# Patient Record
Sex: Male | Born: 1970 | Race: White | Hispanic: No | Marital: Married | Smoking: Never smoker
Health system: Southern US, Community
[De-identification: ages and names within clinical notes are randomized; demographics above are authoritative.]

## PROBLEM LIST (undated history)

## (undated) DIAGNOSIS — T7840XA Allergy, unspecified, initial encounter: Secondary | ICD-10-CM

## (undated) DIAGNOSIS — N200 Calculus of kidney: Secondary | ICD-10-CM

## (undated) DIAGNOSIS — Z789 Other specified health status: Secondary | ICD-10-CM

## (undated) DIAGNOSIS — I10 Essential (primary) hypertension: Secondary | ICD-10-CM

## (undated) HISTORY — PX: EXTRACORPOREAL SHOCK WAVE LITHOTRIPSY: SHX1557

## (undated) HISTORY — DX: Allergy, unspecified, initial encounter: T78.40XA

## (undated) HISTORY — DX: Essential (primary) hypertension: I10

## (undated) HISTORY — DX: Calculus of kidney: N20.0

---

## 1998-11-30 ENCOUNTER — Emergency Department (HOSPITAL_COMMUNITY): Admission: EM | Admit: 1998-11-30 | Discharge: 1998-11-30 | Payer: Self-pay | Admitting: Emergency Medicine

## 1998-11-30 ENCOUNTER — Encounter: Payer: Self-pay | Admitting: Emergency Medicine

## 2007-01-13 ENCOUNTER — Emergency Department: Payer: Self-pay | Admitting: Emergency Medicine

## 2007-02-04 ENCOUNTER — Emergency Department: Payer: Self-pay | Admitting: Emergency Medicine

## 2007-02-05 ENCOUNTER — Ambulatory Visit: Payer: Self-pay | Admitting: Specialist

## 2008-06-16 ENCOUNTER — Emergency Department: Payer: Self-pay | Admitting: Emergency Medicine

## 2008-06-22 ENCOUNTER — Ambulatory Visit: Payer: Self-pay | Admitting: Urology

## 2008-06-23 ENCOUNTER — Ambulatory Visit: Payer: Self-pay | Admitting: Urology

## 2008-07-25 ENCOUNTER — Ambulatory Visit: Payer: Self-pay | Admitting: Urology

## 2008-07-28 ENCOUNTER — Ambulatory Visit: Payer: Self-pay | Admitting: Urology

## 2009-06-29 IMAGING — CT CT ABD-PELV W/O CM
1 of 2 series · 15 of 32 positions shown, 19 images · non-contrast
Comparison: none

REASON FOR EXAM: (1) left flank p; (2) left flank pain
COMMENTS:

[Series 2: soft tissue · axial · 0.91mm/px · z∈[-1036,-523]mm · 15 of 189 slices shown, 19 images]
[im 9/189  soft-tissue]
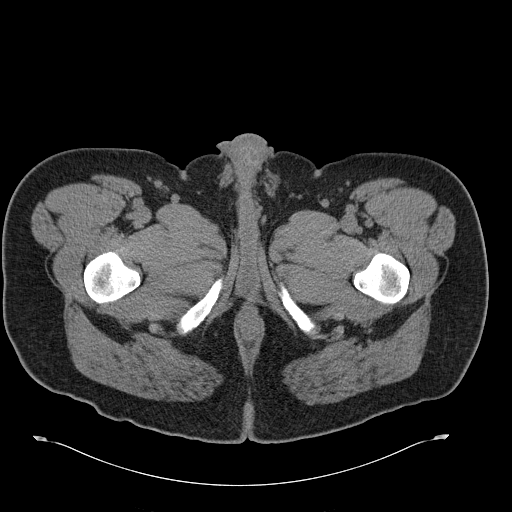
[im 9/189  bone]
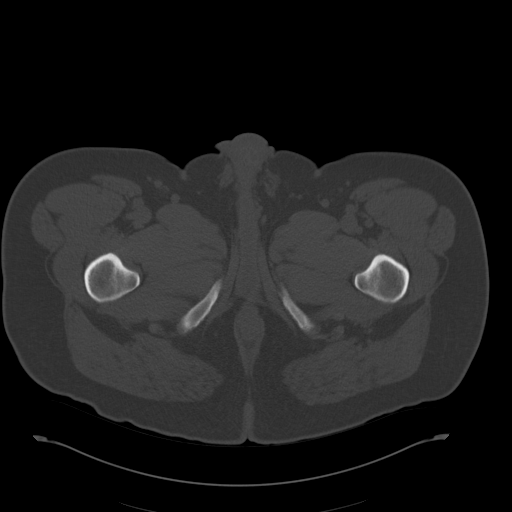
[im 25/189  soft-tissue]
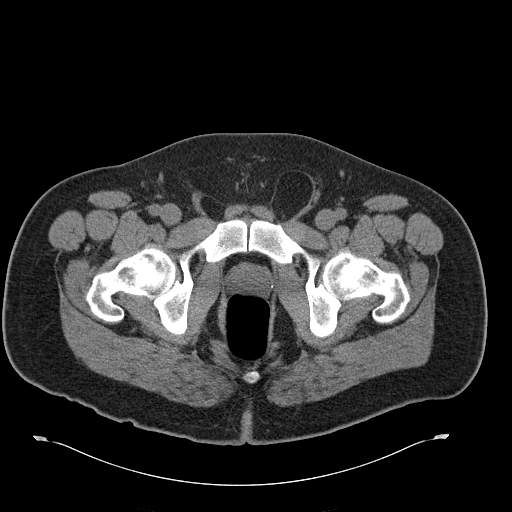
[im 41/189  soft-tissue]
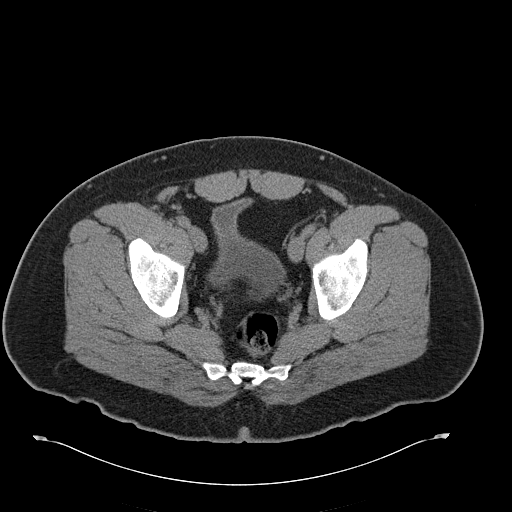
[im 50/189  soft-tissue]
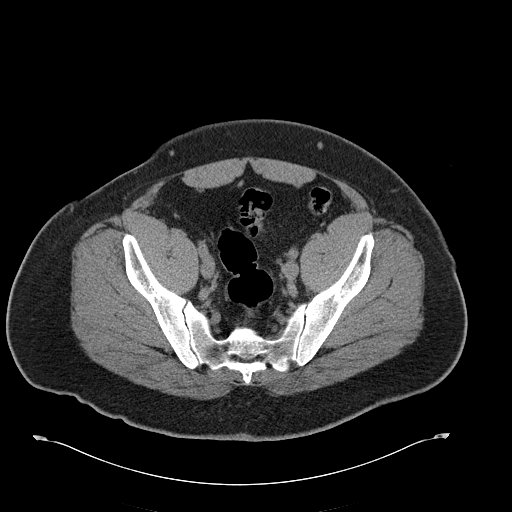
[im 66/189  soft-tissue]
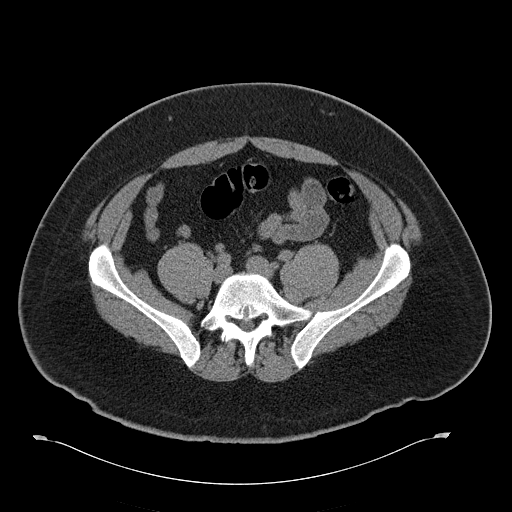
[im 82/189  soft-tissue]
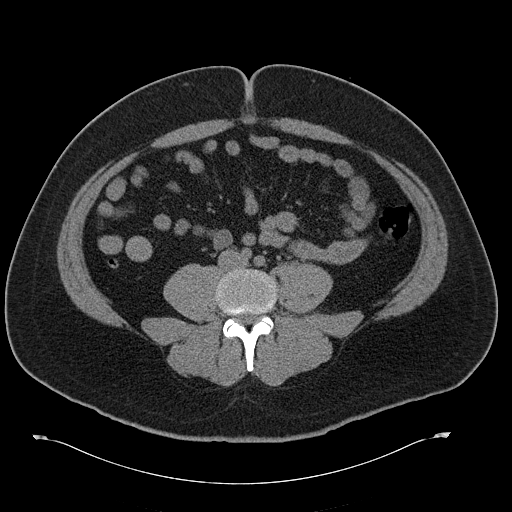
[im 99/189  soft-tissue]
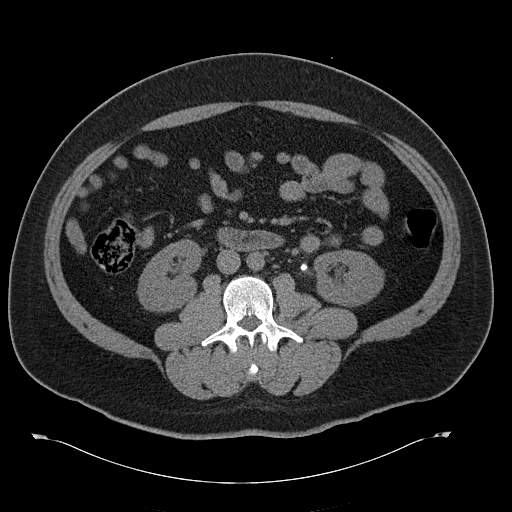
[im 107/189  soft-tissue]
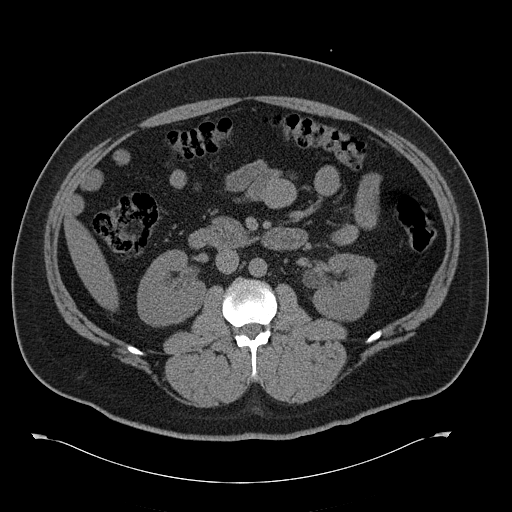
[im 123/189  soft-tissue]
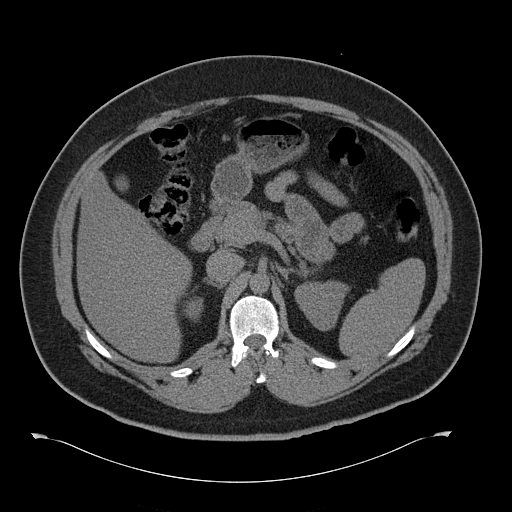
[im 123/189  bone]
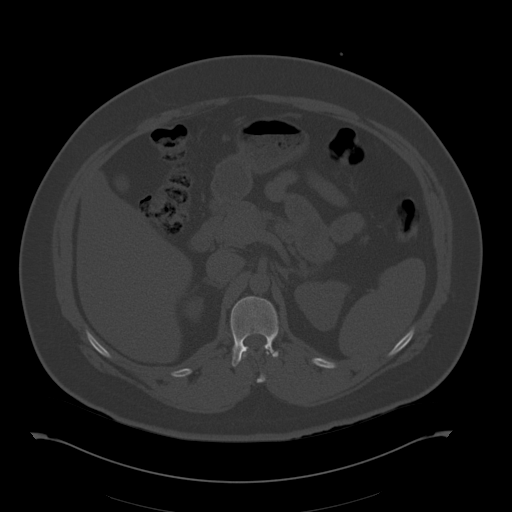
[im 139/189  soft-tissue]
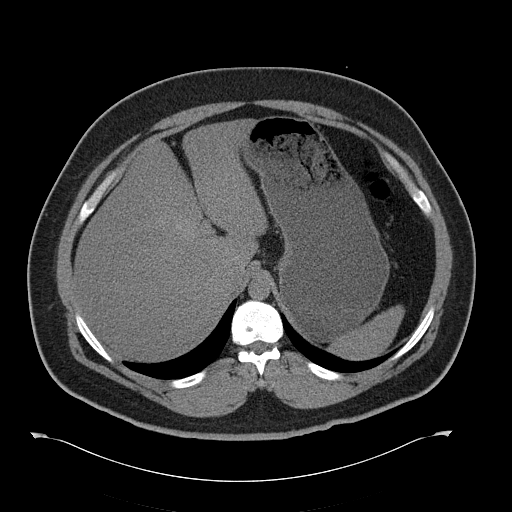
[im 148/189  soft-tissue]
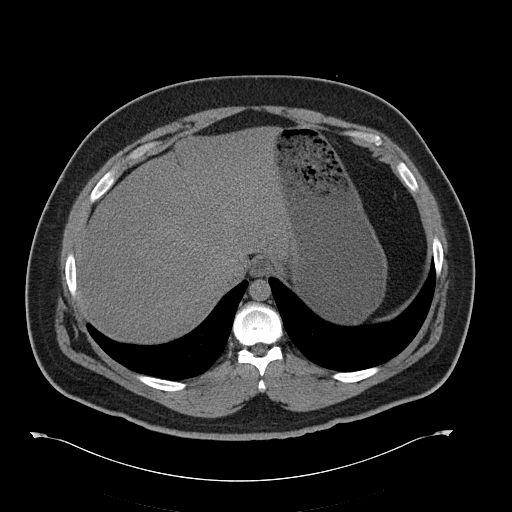
[im 156/189  lung]
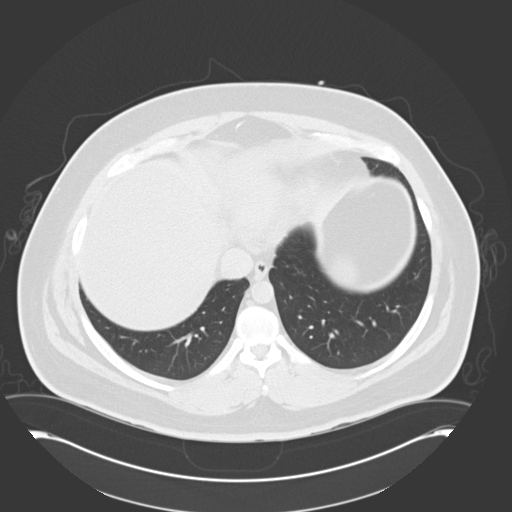
[im 164/189  soft-tissue]
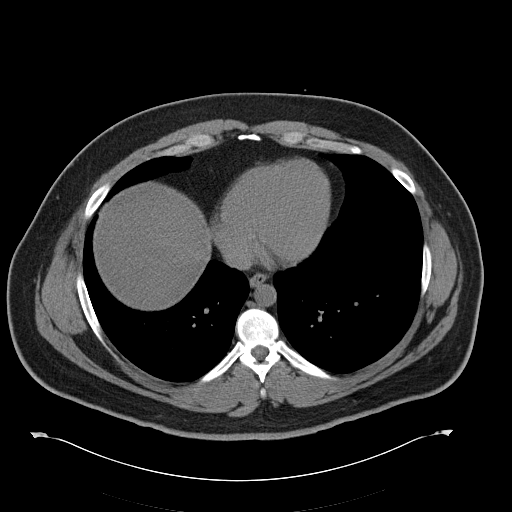
[im 164/189  lung]
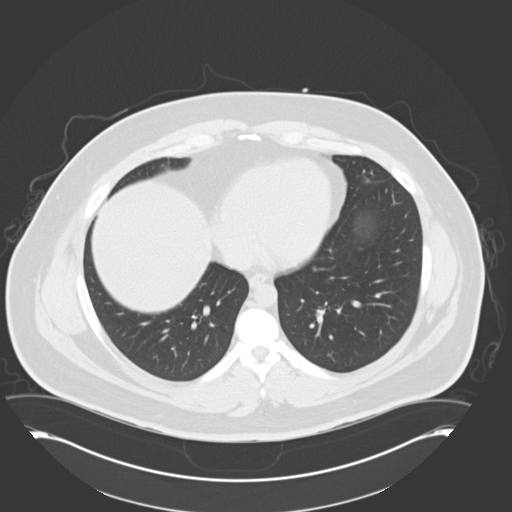
[im 172/189  lung]
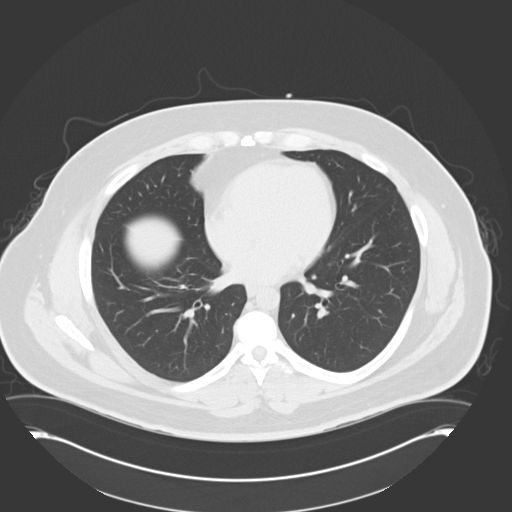
[im 180/189  soft-tissue]
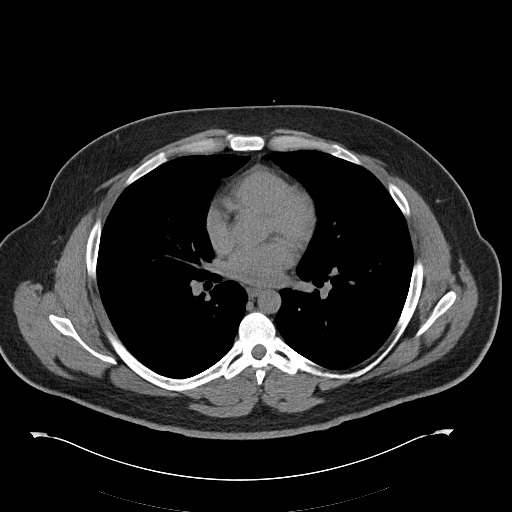
[im 180/189  lung]
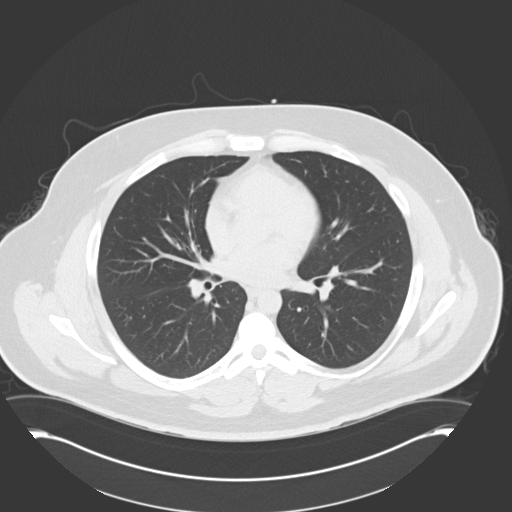

[15 of 32 positions shown; findings below may reference images not displayed]

PROCEDURE:     CT  - CT ABDOMEN AND PELVIS W[DATE]  [DATE]

RESULT:     Emergent CT of the abdomen and pelvis is performed without
contrast utilizing a renal stone protocol. Images are reconstructed in the
axial plane at 3 mm slice thickness. There is no prior exam available for
comparison.

Images through the lung bases demonstrate normal aeration with no nodule and
only minimal atelectasis or fibrosis in the lingula.

The heart is not enlarged. There is a moderately large amount of fluid in
the stomach. There are tiny punctate calcific densities seen in the left
renal collecting system on image 75 and in the lower pole right renal
collecting system on image 94 where there is a 4.5 mm one calcific density.

In the left ureteropelvic junction there is a stone measuring approximately
5 mm causing mild left renal hydronephrosis without significant hydroureter
given the ureteropelvic junction location of the stone. No stones are
evident within the urinary bladder. Pelvic phleboliths appear to be present.
There is no adenopathy, aneurysm or evidence of appendicitis or
diverticulitis. There is no free fluid or free air. No gallstones are
evident. The pancreas, liver and spleen appear grossly normal. The adrenal
glands are unremarkable.
IMPRESSION: 1. 5 mm left ureteropelvic junction stone with mild left hydronephrosis.
2. Bilateral renal stones measuring 1 to 2 mm on the left in the upper pole
region and approximately 5 mm in the lower pole on the right.

## 2009-09-02 ENCOUNTER — Emergency Department: Payer: Self-pay | Admitting: Emergency Medicine

## 2009-09-15 ENCOUNTER — Emergency Department: Payer: Self-pay | Admitting: Emergency Medicine

## 2009-09-18 ENCOUNTER — Ambulatory Visit: Payer: Self-pay | Admitting: Urology

## 2010-07-09 ENCOUNTER — Encounter: Payer: Self-pay | Admitting: Family Medicine

## 2010-11-08 ENCOUNTER — Ambulatory Visit: Payer: Self-pay | Admitting: Urology

## 2013-03-26 ENCOUNTER — Ambulatory Visit: Payer: Self-pay | Admitting: Internal Medicine

## 2013-04-01 ENCOUNTER — Ambulatory Visit: Payer: Self-pay | Admitting: Internal Medicine

## 2014-07-27 ENCOUNTER — Encounter: Payer: Self-pay | Admitting: *Deleted

## 2014-07-28 ENCOUNTER — Encounter
Admission: RE | Disposition: A | Discharge status: Home / Self Care | Payer: Self-pay | Source: Ambulatory Visit | Attending: Urology

## 2014-07-28 ENCOUNTER — Encounter: Payer: Self-pay | Admitting: *Deleted

## 2014-07-28 ENCOUNTER — Ambulatory Visit
Admission: RE | Admit: 2014-07-28 | Discharge: 2014-07-28 | Disposition: A | Discharge status: Home / Self Care | Payer: BC Managed Care – PPO | Source: Ambulatory Visit | Attending: Urology | Admitting: Urology

## 2014-07-28 DIAGNOSIS — Z6838 Body mass index (BMI) 38.0-38.9, adult: Secondary | ICD-10-CM | POA: Insufficient documentation

## 2014-07-28 DIAGNOSIS — N200 Calculus of kidney: Secondary | ICD-10-CM | POA: Diagnosis present

## 2014-07-28 DIAGNOSIS — Z880 Allergy status to penicillin: Secondary | ICD-10-CM | POA: Insufficient documentation

## 2014-07-28 DIAGNOSIS — Z79899 Other long term (current) drug therapy: Secondary | ICD-10-CM | POA: Insufficient documentation

## 2014-07-28 DIAGNOSIS — E669 Obesity, unspecified: Secondary | ICD-10-CM | POA: Insufficient documentation

## 2014-07-28 HISTORY — DX: Other specified health status: Z78.9

## 2014-07-28 HISTORY — PX: EXTRACORPOREAL SHOCK WAVE LITHOTRIPSY: SHX1557

## 2014-07-28 SURGERY — LITHOTRIPSY, ESWL
Anesthesia: Moderate Sedation | Laterality: Left

## 2014-07-28 MED ORDER — MORPHINE SULFATE 10 MG/ML IJ SOLN
INTRAMUSCULAR | Status: AC
  Administered 2014-07-28: 10 mg via INTRAMUSCULAR
  Filled 2014-07-28: qty 1

## 2014-07-28 MED ORDER — LEVOFLOXACIN 500 MG PO TABS: 500.0000 mg | ORAL_TABLET | Freq: Every day | ORAL | Status: DC

## 2014-07-28 MED ORDER — LEVOFLOXACIN 500 MG PO TABS
ORAL_TABLET | ORAL | Status: AC
  Administered 2014-07-28: 500 mg via ORAL
  Filled 2014-07-28: qty 1

## 2014-07-28 MED ORDER — MIDAZOLAM HCL 2 MG/2ML IJ SOLN
INTRAMUSCULAR | Status: AC
  Administered 2014-07-28: 1 mg via INTRAMUSCULAR
  Filled 2014-07-28: qty 2

## 2014-07-28 MED ORDER — PROMETHAZINE HCL 25 MG/ML IJ SOLN
25.0000 mg | Freq: Once | INTRAMUSCULAR | Status: AC
  Administered 2014-07-28: 25 mg via INTRAMUSCULAR

## 2014-07-28 MED ORDER — DIPHENHYDRAMINE HCL 25 MG PO CAPS
ORAL_CAPSULE | ORAL | Status: AC
  Administered 2014-07-28: 25 mg via ORAL
  Filled 2014-07-28: qty 1

## 2014-07-28 MED ORDER — LEVOFLOXACIN 500 MG PO TABS
500.0000 mg | ORAL_TABLET | ORAL | Status: AC
  Administered 2014-07-28: 500 mg via ORAL

## 2014-07-28 MED ORDER — PROMETHAZINE HCL 25 MG/ML IJ SOLN: 25.0000 mg | Freq: Once | INTRAMUSCULAR | Status: DC

## 2014-07-28 MED ORDER — MIDAZOLAM HCL 2 MG/2ML IJ SOLN
1.0000 mg | Freq: Once | INTRAMUSCULAR | Status: AC
  Administered 2014-07-28: 1 mg via INTRAMUSCULAR

## 2014-07-28 MED ORDER — PROMETHAZINE HCL 25 MG/ML IJ SOLN
INTRAMUSCULAR | Status: AC
  Administered 2014-07-28: 25 mg via INTRAMUSCULAR
  Filled 2014-07-28: qty 1

## 2014-07-28 MED ORDER — DIPHENHYDRAMINE HCL 25 MG PO CAPS
25.0000 mg | ORAL_CAPSULE | ORAL | Status: AC
  Administered 2014-07-28: 25 mg via ORAL

## 2014-07-28 MED ORDER — NUCYNTA 50 MG PO TABS: 50.0000 mg | ORAL_TABLET | Freq: Four times a day (QID) | ORAL | Status: DC | PRN

## 2014-07-28 MED ORDER — ONDANSETRON 8 MG PO TBDP: 8.0000 mg | ORAL_TABLET | Freq: Four times a day (QID) | ORAL | Status: DC | PRN

## 2014-07-28 MED ORDER — DEXTROSE-NACL 5-0.45 % IV SOLN
INTRAVENOUS | Status: DC
  Administered 2014-07-28: 07:00:00 via INTRAVENOUS

## 2014-07-28 MED ORDER — MORPHINE SULFATE 10 MG/ML IJ SOLN
10.0000 mg | Freq: Once | INTRAMUSCULAR | Status: AC
  Administered 2014-07-28: 10 mg via INTRAMUSCULAR

## 2014-07-28 MED ORDER — TAMSULOSIN HCL 0.4 MG PO CAPS: 0.4000 mg | ORAL_CAPSULE | Freq: Every day | ORAL | Status: DC

## 2014-07-28 NOTE — OR Nursing (Signed)
Iv in left hand removed before dc home with intact catheter and no redness.

## 2014-07-28 NOTE — H&P (Signed)
  Date of Initial H&P: 07/27/14  History reviewed, patient examined, no change in status, stable for surgery. 

## 2014-07-28 NOTE — Discharge Instructions (Addendum)
Kidney Stones Kidney stones (urolithiasis) are solid masses that form inside your kidneys. The intense pain is caused by the stone moving through the kidney, ureter, bladder, and urethra (urinary tract). When the stone moves, the ureter starts to spasm around the stone. The stone is usually passed in your pee (urine).  HOME CARE  Drink enough fluids to keep your pee clear or pale yellow. This helps to get the stone out.  Strain all pee through the provided strainer. Do not pee without peeing through the strainer, not even once. If you pee the stone out, catch it in the strainer. The stone may be as small as a grain of salt. Take this to your doctor. This will help your doctor figure out what you can do to try to prevent more kidney stones.  Only take medicine as told by your doctor.  Follow up with your doctor as told.  Get follow-up X-rays as told by your doctor. GET HELP IF: You have pain that gets worse even if you have been taking pain medicine. GET HELP RIGHT AWAY IF:   Your pain does not get better with medicine.  You have a fever or shaking chills.  Your pain increases and gets worse over 18 hours.  You have new belly (abdominal) pain.  You feel faint or pass out.  You are unable to pee. MAKE SURE YOU:   Understand these instructions.  Will watch your condition.  Will get help right away if you are not doing well or get worse. Document Released: 06/12/2007 Document Revised: 08/26/2012 Document Reviewed: 05/27/2012 [facility] Patient Information 2015 [place], [place]. This information is not intended to replace advice given to you by your health care provider. Make sure you discuss any questions you have with your health care provider.  Dietary Guidelines to Help Prevent Kidney Stones Your risk of kidney stones can be decreased by adjusting the foods you eat. The most important thing you can do is drink enough fluid. You should drink enough fluid to keep your urine clear or  pale yellow. The following guidelines provide specific information for the type of kidney stone you have had. GUIDELINES ACCORDING TO TYPE OF KIDNEY STONE Calcium Oxalate Kidney Stones  Reduce the amount of salt you eat. Foods that have a lot of salt cause your body to release excess calcium into your urine. The excess calcium can combine with a substance called oxalate to form kidney stones.  Reduce the amount of animal protein you eat if the amount you eat is excessive. Animal protein causes your body to release excess calcium into your urine. Ask your dietitian how much protein from animal sources you should be eating.  Avoid foods that are high in oxalates. If you take vitamins, they should have less than 500 mg of vitamin C. Your body turns vitamin C into oxalates. You do not need to avoid fruits and vegetables high in vitamin C. Calcium Phosphate Kidney Stones  Reduce the amount of salt you eat to help prevent the release of excess calcium into your urine.  Reduce the amount of animal protein you eat if the amount you eat is excessive. Animal protein causes your body to release excess calcium into your urine. Ask your dietitian how much protein from animal sources you should be eating.  Get enough calcium from food or take a calcium supplement (ask your dietitian for recommendations). Food sources of calcium that do not increase your risk of kidney stones include:  Broccoli.  Dairy products,  such as cheese and yogurt.  Pudding. Uric Acid Kidney Stones  Do not have more than 6 oz of animal protein per day. FOOD SOURCES Animal Protein Sources  Meat (all types).  Poultry.  Eggs.  Fish, seafood. Foods High in [employer] seasonings.  Soy sauce.  Teriyaki sauce.  Cured and processed meats.  Salted crackers and snack foods.  Fast food.  Canned soups and most canned foods. Foods High in Oxalates  Grains:  Amaranth.  Barley.  Grits.  Wheat  germ.  Bran.  Buckwheat flour.  All bran cereals.  Pretzels.  Whole wheat bread.  Vegetables:  Beans (wax).  Beets and beet greens.  Collard greens.  Eggplant.  [place].  Leeks.  Okra.  Parsley.  Rutabagas.  Spinach.  Swiss chard.  Tomato paste.  Fried potatoes.  Sweet potatoes.  Fruits:  Red currants.  Figs.  Kiwi.  Rhubarb.  Meat and Other Protein Sources:  Beans (dried).  Soy burgers and other soybean products.  Miso.  Nuts (peanuts, almonds, pecans, cashews, hazelnuts).  Nut butters.  Sesame seeds and tahini (paste made of sesame seeds).  Poppy seeds.  Beverages:  Chocolate drink mixes.  Soy milk.  Instant iced tea.  Juices made from high-oxalate fruits or vegetables.  Other:  Carob.  Chocolate.  Fruitcake.  Marmalades. Document Released: 04/20/2010 Document Revised: 12/29/2012 Document Reviewed: 11/20/2012 [facility] Patient Information 2015 [place], [place]. This information is not intended to replace advice given to you by your health care provider. Make sure you discuss any questions you have with your health care provider.  Kidney Stones Kidney stones (urolithiasis) are deposits that form inside your kidneys. The intense pain is caused by the stone moving through the urinary tract. When the stone moves, the ureter goes into spasm around the stone. The stone is usually passed in the urine.  CAUSES   A disorder that makes certain neck glands produce too much parathyroid hormone (primary hyperparathyroidism).  A buildup of uric acid crystals, similar to gout in your joints.  Narrowing (stricture) of the ureter.  A kidney obstruction present at birth (congenital obstruction).  Previous surgery on the kidney or ureters.  Numerous kidney infections. SYMPTOMS   Feeling sick to your stomach (nauseous).  Throwing up (vomiting).  Blood in the urine (hematuria).  Pain that usually spreads (radiates) to the  groin.  Frequency or urgency of urination. DIAGNOSIS   Taking a history and physical exam.  Blood or urine tests.  CT scan.  Occasionally, an examination of the inside of the urinary bladder (cystoscopy) is performed. TREATMENT   Observation.  Increasing your fluid intake.  Extracorporeal shock wave lithotripsy--This is a noninvasive procedure that uses shock waves to break up kidney stones.  Surgery may be needed if you have severe pain or persistent obstruction. There are various surgical procedures. Most of the procedures are performed with the use of small instruments. Only small incisions are needed to accommodate these instruments, so recovery time is minimized. The size, location, and chemical composition are all important variables that will determine the proper choice of action for you. Talk to your health care provider to better understand your situation so that you will minimize the risk of injury to yourself and your kidney.  HOME CARE INSTRUCTIONS   Drink enough water and fluids to keep your urine clear or pale yellow. This will help you to pass the stone or stone fragments.  Strain all urine through the provided strainer. Keep all particulate matter and  stones for your health care provider to see. The stone causing the pain may be as small as a grain of salt. It is very important to use the strainer each and every time you pass your urine. The collection of your stone will allow your health care provider to analyze it and verify that a stone has actually passed. The stone analysis will often identify what you can do to reduce the incidence of recurrences.  Only take over-the-counter or prescription medicines for pain, discomfort, or fever as directed by your health care provider.  Make a follow-up appointment with your health care provider as directed.  Get follow-up X-rays if required. The absence of pain does not always mean that the stone has passed. It may have only  stopped moving. If the urine remains completely obstructed, it can cause loss of kidney function or even complete destruction of the kidney. It is your responsibility to make sure X-rays and follow-ups are completed. Ultrasounds of the kidney can show blockages and the status of the kidney. Ultrasounds are not associated with any radiation and can be performed easily in a matter of minutes. SEEK MEDICAL CARE IF:  You experience pain that is progressive and unresponsive to any pain medicine you have been prescribed. SEEK IMMEDIATE MEDICAL CARE IF:   Pain cannot be controlled with the prescribed medicine.  You have a fever or shaking chills.  The severity or intensity of pain increases over 18 hours and is not relieved by pain medicine.  You develop a new onset of abdominal pain.  You feel faint or pass out.  You are unable to urinate. MAKE SURE YOU:   Understand these instructions.  Will watch your condition.  Will get help right away if you are not doing well or get worse. Document Released: 12/24/2004 Document Revised: 08/26/2012 Document Reviewed: 05/27/2012 [facility] Patient Information 2015 [place], [place]. This information is not intended to replace advice given to you by your health care provider. Make sure you discuss any questions you have with your health care provider.  Lithotripsy for Kidney Stones Lithotripsy is a treatment that can sometimes help eliminate kidney stones and pain that they cause. A form of lithotripsy, also known as extracorporeal shock wave lithotripsy, is a nonsurgical procedure that helps your body rid itself of the kidney stone when it is too big to pass on its own. Extracorporeal shock wave lithotripsy is a method of crushing a kidney stone with shock waves. These shock waves pass through your body and are focused on your stone. They cause the kidney stones to crumble while still in the urinary tract. It is then easier for the smaller pieces of stone to  pass in the urine. Lithotripsy usually takes about an hour. It is done in a hospital, a lithotripsy center, or a mobile unit. It usually does not require an overnight stay. Your health care provider will instruct you on preparation for the procedure. Your health care provider will tell you what to expect afterward. LET [facility] CARE PROVIDER KNOW ABOUT:  Any allergies you have.  All medicines you are taking, including vitamins, herbs, eye drops, creams, and over-the-counter medicines.  Previous problems you or members of your family have had with the use of anesthetics.  Any blood disorders you have.  Previous surgeries you have had.  Medical conditions you have. RISKS AND COMPLICATIONS Generally, lithotripsy for kidney stones is a safe procedure. However, as with any procedure, complications can occur. Possible complications include:  Infection.  Bleeding of the kidney.  Bruising of the kidney or skin.  Obstruction of the ureter.  Failure of the stone to fragment. BEFORE THE PROCEDURE  Do not eat or drink for 6-8 hours prior to the procedure. You may, however, take the medications with a sip of water that your physician instructs you to take  Do not take aspirin or aspirin-containing products for 7 days prior to your procedure  Do not take nonsteroidal anti-inflammatory products for 7 days prior to your procedure PROCEDURE A stent (flexible tube with holes) may be placed in your ureter. The ureter is the tube that transports the urine from the kidneys to the bladder. Your health care provider may place a stent before the procedure. This will help keep urine flowing from the kidney if the fragments of the stone block the ureter. You may have an IV tube placed in one of your veins to give you fluids and medicines. These medicines may help you relax or make you sleep. During the procedure, you will lie comfortably on a fluid-filled cushion or in a warm-water bath. After an X-ray  or ultrasound exam to locate your stone, shock waves are aimed at the stone. If you are awake, you may feel a tapping sensation as the shock waves pass through your body. If large stone particles remain after treatment, a second procedure may be necessary at a later date. For comfort during the test:  Relax as much as possible.  Try to remain still as much as possible.  Try to follow instructions to speed up the test.  Let your health care provider know if you are uncomfortable, anxious, or in pain. AFTER THE PROCEDURE  After surgery, you will be taken to the recovery area. A nurse will watch and check your progress. Once you're awake, stable, and taking fluids well, you will be allowed to go home as long as there are no problems. You will also be allowed to pass your urine before discharge.You may be given antibiotics to help prevent infection. You may also be prescribed pain medicine if needed. In a week or two, your health care provider may remove your stent, if you have one. You may first have an X-ray exam to check on how successful the fragmentation of your stone has been and how much of the stone has passed. Your health care provider will check to see whether or not stone particles remain. SEEK IMMEDIATE MEDICAL CARE IF:  You develop a fever or shaking chills.  Your pain is not relieved by medicine.  You feel sick to your stomach (nauseated) and you vomit.  You develop heavy bleeding.  You have difficulty urinating.  You start to pass your stent from your penis. Document Released: 12/22/1999 Document Revised: 10/14/2012 Document Reviewed: 07/09/2012 [facility] Patient Information 2015 [place], [place]. This information is not intended to replace advice given to you by your health care provider. Make sure you discuss any questions you have with your health care provider.   AMBULATORY SURGERY  DISCHARGE INSTRUCTIONS   1) The drugs that you were given will stay in your system until  tomorrow so for the next 24 hours you should not:  A) Drive an automobile B) Make any legal decisions C) Drink any alcoholic beverage   2) You may resume regular meals tomorrow.  Today it is better to start with liquids and gradually work up to solid foods.  You may eat anything you prefer, but it is better to start with liquids, then  soup and crackers, and gradually work up to solid foods.   3) Please notify your doctor immediately if you have any unusual bleeding, trouble breathing, redness and pain at the surgery site, drainage, fever, or pain not relieved by medication.    4) Additional Instructions:  Please contact your physician with any problems or Same Day Surgery at [phone], Monday through Friday 6 am to 4 pm, or [place] [facility] number at [phone].

## 2014-09-16 ENCOUNTER — Encounter: Payer: Self-pay | Admitting: Urology

## 2015-12-06 MED ORDER — LEVOFLOXACIN 500 MG PO TABS
500.0000 mg | ORAL_TABLET | ORAL | Status: AC
  Administered 2015-12-07: 500 mg via ORAL

## 2015-12-07 ENCOUNTER — Encounter: Payer: Self-pay | Admitting: *Deleted

## 2015-12-07 ENCOUNTER — Ambulatory Visit
Admission: RE | Admit: 2015-12-07 | Discharge: 2015-12-07 | Disposition: A | Discharge status: Home / Self Care | Payer: BC Managed Care – PPO | Source: Ambulatory Visit | Attending: Urology | Admitting: Urology

## 2015-12-07 ENCOUNTER — Encounter
Admission: RE | Disposition: A | Discharge status: Home / Self Care | Payer: Self-pay | Source: Ambulatory Visit | Attending: Urology

## 2015-12-07 DIAGNOSIS — E669 Obesity, unspecified: Secondary | ICD-10-CM | POA: Diagnosis not present

## 2015-12-07 DIAGNOSIS — N200 Calculus of kidney: Secondary | ICD-10-CM | POA: Insufficient documentation

## 2015-12-07 DIAGNOSIS — R109 Unspecified abdominal pain: Secondary | ICD-10-CM | POA: Diagnosis present

## 2015-12-07 DIAGNOSIS — Z6841 Body Mass Index (BMI) 40.0 and over, adult: Secondary | ICD-10-CM | POA: Insufficient documentation

## 2015-12-07 HISTORY — PX: EXTRACORPOREAL SHOCK WAVE LITHOTRIPSY: SHX1557

## 2015-12-07 SURGERY — LITHOTRIPSY, ESWL
Anesthesia: Moderate Sedation | Laterality: Left

## 2015-12-07 MED ORDER — DEXTROSE-NACL 5-0.45 % IV SOLN
INTRAVENOUS | Status: DC
  Administered 2015-12-07: 1 mL via INTRAVENOUS

## 2015-12-07 MED ORDER — LEVOFLOXACIN 500 MG PO TABS
ORAL_TABLET | ORAL | Status: AC
  Filled 2015-12-07: qty 1

## 2015-12-07 MED ORDER — PROMETHAZINE HCL 25 MG/ML IJ SOLN
INTRAMUSCULAR | Status: AC
  Filled 2015-12-07: qty 1

## 2015-12-07 MED ORDER — PROMETHAZINE HCL 25 MG/ML IJ SOLN
25.0000 mg | Freq: Once | INTRAMUSCULAR | Status: AC
  Administered 2015-12-07: 25 mg via INTRAMUSCULAR

## 2015-12-07 MED ORDER — MORPHINE SULFATE (PF) 10 MG/ML IV SOLN
INTRAVENOUS | Status: AC
  Filled 2015-12-07: qty 1

## 2015-12-07 MED ORDER — MIDAZOLAM HCL 2 MG/2ML IJ SOLN
1.0000 mg | Freq: Once | INTRAMUSCULAR | Status: AC
  Administered 2015-12-07: 1 mg via INTRAMUSCULAR

## 2015-12-07 MED ORDER — DIPHENHYDRAMINE HCL 25 MG PO CAPS
ORAL_CAPSULE | ORAL | Status: AC
  Filled 2015-12-07: qty 1

## 2015-12-07 MED ORDER — FUROSEMIDE 10 MG/ML IJ SOLN
10.0000 mg | Freq: Once | INTRAMUSCULAR | Status: AC
  Administered 2015-12-07: 10 mg via INTRAVENOUS

## 2015-12-07 MED ORDER — NUCYNTA 50 MG PO TABS: 50.0000 mg | ORAL_TABLET | Freq: Four times a day (QID) | ORAL | 0 refills | Status: DC | PRN

## 2015-12-07 MED ORDER — LEVOFLOXACIN 500 MG PO TABS: 500.0000 mg | ORAL_TABLET | Freq: Every day | ORAL | 1 refills | Status: DC

## 2015-12-07 MED ORDER — MIDAZOLAM HCL 2 MG/2ML IJ SOLN
INTRAMUSCULAR | Status: AC
  Filled 2015-12-07: qty 2

## 2015-12-07 MED ORDER — TAMSULOSIN HCL 0.4 MG PO CAPS: 0.4000 mg | ORAL_CAPSULE | Freq: Every day | ORAL | 11 refills | Status: DC

## 2015-12-07 MED ORDER — FUROSEMIDE 10 MG/ML IJ SOLN
INTRAMUSCULAR | Status: AC
  Administered 2015-12-07: 10 mg via INTRAVENOUS
  Filled 2015-12-07: qty 2

## 2015-12-07 MED ORDER — MORPHINE SULFATE (PF) 2 MG/ML IV SOLN
10.0000 mg | Freq: Once | INTRAVENOUS | Status: AC
  Administered 2015-12-07: 10 mg via INTRAMUSCULAR
  Filled 2015-12-07: qty 5

## 2015-12-07 MED ORDER — DIPHENHYDRAMINE HCL 25 MG PO CAPS
25.0000 mg | ORAL_CAPSULE | ORAL | Status: AC
  Administered 2015-12-07: 25 mg via ORAL

## 2015-12-07 MED ORDER — ONDANSETRON 8 MG PO TBDP: 8.0000 mg | ORAL_TABLET | Freq: Four times a day (QID) | ORAL | 3 refills | Status: DC | PRN

## 2015-12-07 NOTE — Discharge Instructions (Signed)
AMBULATORY SURGERY  DISCHARGE INSTRUCTIONS   1) The drugs that you were given will stay in your system until tomorrow so for the next 24 hours you should not:  A) Drive an automobile B) Make any legal decisions C) Drink any alcoholic beverage   2) You may resume regular meals tomorrow.  Today it is better to start with liquids and gradually work up to solid foods.  You may eat anything you prefer, but it is better to start with liquids, then soup and crackers, and gradually work up to solid foods.   3) Please notify your doctor immediately if you have any unusual bleeding, trouble breathing, redness and pain at the surgery site, drainage, fever, or pain not relieved by medication.    4) Additional Instructions: TAKE A STOOL SOFTENER TWICE A DAY WHILE TAKING NARCOTIC PAIN MEDICINE TO PREVENT CONSTIPATION   Please contact your physician with any problems or Same Day Surgery at [phone], Monday through Friday 6 am to 4 pm, or [place] [facility] number at [phone].   Dietary Guidelines to Help Prevent Kidney Stones Your risk of kidney stones can be decreased by adjusting the foods you eat. The most important thing you can do is drink enough fluid. You should drink enough fluid to keep your urine clear or pale yellow. The following guidelines provide specific information for the type of kidney stone you have had. Guidelines according to type of kidney stone Calcium Oxalate Kidney Stones  Reduce the amount of salt you eat. Foods that have a lot of salt cause your body to release excess calcium into your urine. The excess calcium can combine with a substance called oxalate to form kidney stones.  Reduce the amount of animal protein you eat if the amount you eat is excessive. Animal protein causes your body to release excess calcium into your urine. Ask your dietitian how much protein from animal sources you should be eating.  Avoid foods that are high in oxalates. If you  take vitamins, they should have less than 500 mg of vitamin C. Your body turns vitamin C into oxalates. You do not need to avoid fruits and vegetables high in vitamin C. Calcium Phosphate Kidney Stones  Reduce the amount of salt you eat to help prevent the release of excess calcium into your urine.  Reduce the amount of animal protein you eat if the amount you eat is excessive. Animal protein causes your body to release excess calcium into your urine. Ask your dietitian how much protein from animal sources you should be eating.  Get enough calcium from food or take a calcium supplement (ask your dietitian for recommendations). Food sources of calcium that do not increase your risk of kidney stones include:  Broccoli.  Dairy products, such as cheese and yogurt.  Pudding. Uric Acid Kidney Stones  Do not have more than 6 oz of animal protein per day. Food sources Electrical engineer (all types).  Poultry.  Eggs.  Fish, seafood. Foods High in [employer] seasonings.  Soy sauce.  Teriyaki sauce.  Cured and processed meats.  Salted crackers and snack foods.  Fast food.  Canned soups and most canned foods. Foods High in Oxalates  Grains:  Amaranth.  Barley.  Grits.  Wheat germ.  Bran.  Buckwheat flour.  All bran cereals.  Pretzels.  Whole wheat bread.  Vegetables:  Beans (wax).  Beets and beet greens.  Collard greens.  Eggplant.  Escarole.  Leeks.  Okra.  Parsley.  Rutabagas.  Spinach.  Swiss chard.  Tomato paste.  Fried potatoes.  Sweet potatoes.  Fruits:  Red currants.  Figs.  Kiwi.  Rhubarb.  Meat and Other Protein Sources:  Beans (dried).  Soy burgers and other soybean products.  Miso.  Nuts (peanuts, almonds, pecans, cashews, hazelnuts).  Nut butters.  Sesame seeds and tahini (paste made of sesame seeds).  Poppy seeds.  Beverages:  Chocolate drink mixes.  Soy milk.  Instant iced  tea.  Juices made from high-oxalate fruits or vegetables.  Other:  Carob.  Chocolate.  Fruitcake.  Marmalades. This information is not intended to replace advice given to you by your health care provider. Make sure you discuss any questions you have with your health care provider. Document Released: 04/20/2010 Document Revised: 06/01/2015 Document Reviewed: 11/20/2012 Elsevier Interactive Patient Education  2017 [employer] Inc[employer]   Kidney Stones Kidney stones (urolithiasis) are solid, rock-like deposits that form inside of the organs that make urine (kidneys). A kidney stone may form in a kidney and move into the bladder, where it can cause intense pain and block the flow of urine. Kidney stones are created when high levels of certain minerals are found in the urine. They are usually passed through urination, but in some cases, medical treatment may be needed to remove them. What are the causes? Kidney stones may be caused by:  A condition in which certain glands produce too much parathyroid hormone (primary hyperparathyroidism), which causes too much calcium buildup in the blood.  Buildup of uric acid crystals in the bladder (hyperuricosuria). Uric acid is a chemical that the body produces when you eat certain foods. It usually exits the body in the urine.  Narrowing (stricture) of one or both of the tubes that drain urine from the kidneys to the bladder (ureters).  A kidney blockage that is present at birth (congenital obstruction).  Past surgery on the kidney or the ureters, such as gastric bypass surgery. What increases the risk? The following factors make you more likely to develop kidney stones:  Having had a kidney stone in the past.  Having a family history of kidney stones.  Not drinking enough water.  Eating a diet that is high in protein, salt (sodium), or sugar.  Being overweight or obese. What are the signs or symptoms? Symptoms of a kidney stone may  include:  Nausea.  Vomiting.  Blood in the urine (hematuria).  Pain in the side of the abdomen, right below the ribs (flank pain). Pain usually spreads (radiates) to the groin.  Needing to urinate frequently or urgently. How is this diagnosed? This condition may be diagnosed based on:  Your medical history.  A physical exam.  Blood tests.  Urine tests.  CT scan.  Abdominal X-ray.  A procedure to examine the inside of the bladder (cystoscopy). How is this treated? Treatment for kidney stones depends on the size, location, and makeup of the stones. Treatment may involve:  Analyzing your urine before and after you pass the stone through urination.  Being monitored at the hospital until you pass the stone through urination.  Increasing your fluid intake and decreasing the amount of calcium and protein in your diet.  A procedure to break up kidney stones in the bladder using:  A focused beam of light (laser therapy).  Shock waves (extracorporeal shock wave lithotripsy).  Surgery to remove kidney stones. This may be needed if you have severe pain or have stones that block your urinary tract. Follow these  instructions at home: Eating and drinking  Drink enough fluid to keep your urine clear or pale yellow. This will help you to pass the kidney stone.  If directed, change your diet. This may include:  Limiting how much sodium you eat.  Eating more fruits and vegetables.  Limiting how much meat, poultry, fish, and eggs you eat.  Follow instructions from your health care provider about eating or drinking restrictions. General instructions  Collect urine samples as told by your health care provider. You may need to collect a urine sample:  24 hours after you pass the stone.  8-12 weeks after passing the kidney stone, and every 6-12 months after that.  Strain your urine every time you urinate, for as long as directed. Use the strainer that your health care  provider recommends.  Do not throw out the kidney stone after passing it. Keep the stone so it can be tested by your health care provider. Testing the makeup of your kidney stone may help prevent you from getting kidney stones in the future.  Take over-the-counter and prescription medicines only as told by your health care provider.  Keep all follow-up visits as told by your health care provider. This is important. You may need follow-up X-rays or ultrasounds to make sure that your stone has passed. How is this prevented? To prevent another kidney stone:  Drink enough fluid to keep your urine clear or pale yellow. This is the best way to prevent kidney stones.  Eat a healthy diet and follow recommendations from your health care provider about foods to avoid. You may be instructed to eat a low-protein diet. Recommendations vary depending on the type of kidney stone that you have.  Maintain a healthy weight. Contact a health care provider if:  You have pain that gets worse or does not get better with medicine. Get help right away if:  You have a fever or chills.  You develop severe pain.  You develop new abdominal pain.  You faint.  You are unable to urinate. This information is not intended to replace advice given to you by your health care provider. Make sure you discuss any questions you have with your health care provider. Document Released: 12/24/2004 Document Revised: 07/14/2015 Document Reviewed: 06/09/2015 Elsevier Interactive Patient Education  2017 Else[employer] Inc.   Renal Colic Renal colic is pain that is caused by passing a kidney stone. The pain can be sharp and severe. It may be felt in the back, abdomen, side (flank), or groin. It can cause nausea. Renal colic can come and go. Follow these instructions at home: Watch your condition for any changes. The following actions may help to lessen any discomfort that you are feeling:  Take medicines only as directed by your  health care provider.  Ask your health care provider if it is okay to take over-the-counter pain medicine.  Drink enough fluid to keep your urine clear or pale yellow. Drink 6-8 glasses of water each day.  Limit the amount of salt that you eat to less than 2 grams per day.  Reduce the amount of protein in your diet. Eat less meat, fish, nuts, and dairy.  Avoid foods such as spinach, rhubarb, nuts, or bran. These may make kidney stones more likely to form. Contact a health care provider if:  You have a fever or chills.  Your urine smells bad or looks cloudy.  You have pain or burning when you pass urine. Get help right away if:  Your  flank pain or groin pain suddenly worsens.  You become confused or disoriented or you lose consciousness. This information is not intended to replace advice given to you by your health care provider. Make sure you discuss any questions you have with your health care provider. Document Released: 10/03/2004 Document Revised: 05/30/2015 Document Reviewed: 11/03/2013 Elsevier Interactive Patient Education  2017 Elsevier Inc. Lithotripsy, Care After Refer to this sheet in the next few weeks. These instructions provide you with information on caring for yourself after your procedure. Your health care provider may also give you more specific instructions. Your treatment has been planned according to current medical practices, but problems sometimes occur. Call your health care provider if you have any problems or questions after your procedure. WHAT TO EXPECT AFTER THE PROCEDURE   Your urine may have a red tinge for a few days after treatment. Blood loss is usually minimal.  You may have soreness in the back or flank area. This usually goes away after a few days. The procedure can cause blotches or bruises on the back where the pressure wave enters the skin. These marks usually cause only minimal discomfort and should disappear in a short time.  Stone  fragments should begin to pass within 24 hours of treatment. However, a delayed passage is not unusual.  You may have pain, discomfort, and feel sick to your stomach (nauseated) when the crushed fragments of stone are passed down the tube from the kidney to the bladder. Stone fragments can pass soon after the procedure and may last for up to 4-8 weeks.  A small number of patients may have severe pain when stone fragments are not able to pass, which leads to an obstruction.  If your stone is greater than 1 inch (2.5 cm) in diameter or if you have multiple stones that have a combined diameter greater than 1 inch (2.5 cm), you may require more than one treatment.  If you had a stent placed prior to your procedure, you may experience some discomfort, especially during urination. You may experience the pain or discomfort in your flank or back, or you may experience a sharp pain or discomfort at the base of your penis or in your lower abdomen. The discomfort usually lasts only a few minutes after urinating. HOME CARE INSTRUCTIONS   Rest at home until you feel your energy improving.  Only take over-the-counter or prescription medicines for pain, discomfort, or fever as directed by your health care provider. Depending on the type of lithotripsy, you may need to take antibiotics and anti-inflammatory medicines for a few days.  Drink enough water and fluids to keep your urine clear or pale yellow. This helps "flush" your kidneys. It helps pass any remaining pieces of stone and prevents stones from coming back.  Most people can resume daily activities within 1-2 days after standard lithotripsy. It can take longer to recover from laser and percutaneous lithotripsy.  Strain all urine through the provided strainer. Keep all particulate matter and stones for your health care provider to see. The stone may be as small as a grain of salt. It is very important to use the strainer each and every time you pass your  urine. Any stones that are found can be sent to a medical lab for examination.  Visit your health care provider for a follow-up appointment in a few weeks. Your doctor may remove your stent if you have one. Your health care provider will also check to see whether stone particles still  remain. SEEK MEDICAL CARE IF:   Your pain is not relieved by medicine.  You have a lasting nauseous feeling.  You feel there is too much blood in the urine.  You develop persistent problems with frequent or painful urination that does not at least partially improve after 2 days following the procedure.  You have a congested cough.  You feel lightheaded.  You develop a rash or any other signs that might suggest an allergic problem.  You develop any reaction or side effects to your medicine(s). SEEK IMMEDIATE MEDICAL CARE IF:   You experience severe back or flank pain or both.  You see nothing but blood when you urinate.  You cannot pass any urine at all.  You have a fever or shaking chills.  You develop shortness of breath, difficulty breathing, or chest pain.  You develop vomiting that will not stop after 6-8 hours.  You have a fainting episode. This information is not intended to replace advice given to you by your health care provider. Make sure you discuss any questions you have with your health care provider. Document Released: 01/13/2007 Document Revised: 09/14/2014 Document Reviewed: 07/09/2012 Elsevier Interactive Patient Education  2017 [employer].

## 2015-12-07 NOTE — OR Nursing (Addendum)
IV site CDI. Report taken from Darrel HooverMelanie Hebert RN

## 2017-06-05 ENCOUNTER — Encounter
Admission: RE | Disposition: A | Discharge status: Home / Self Care | Payer: Self-pay | Source: Ambulatory Visit | Attending: Urology

## 2017-06-05 ENCOUNTER — Other Ambulatory Visit: Payer: Self-pay

## 2017-06-05 ENCOUNTER — Encounter: Payer: Self-pay | Admitting: *Deleted

## 2017-06-05 ENCOUNTER — Ambulatory Visit
Admission: RE | Admit: 2017-06-05 | Discharge: 2017-06-05 | Disposition: A | Discharge status: Home / Self Care | Payer: BLUE CROSS/BLUE SHIELD | Source: Ambulatory Visit | Attending: Urology | Admitting: Urology

## 2017-06-05 DIAGNOSIS — E669 Obesity, unspecified: Secondary | ICD-10-CM | POA: Insufficient documentation

## 2017-06-05 DIAGNOSIS — N200 Calculus of kidney: Secondary | ICD-10-CM | POA: Diagnosis present

## 2017-06-05 DIAGNOSIS — Z8249 Family history of ischemic heart disease and other diseases of the circulatory system: Secondary | ICD-10-CM | POA: Diagnosis not present

## 2017-06-05 DIAGNOSIS — Z7901 Long term (current) use of anticoagulants: Secondary | ICD-10-CM | POA: Diagnosis not present

## 2017-06-05 DIAGNOSIS — Z880 Allergy status to penicillin: Secondary | ICD-10-CM | POA: Insufficient documentation

## 2017-06-05 DIAGNOSIS — Z6839 Body mass index (BMI) 39.0-39.9, adult: Secondary | ICD-10-CM | POA: Diagnosis not present

## 2017-06-05 DIAGNOSIS — Z791 Long term (current) use of non-steroidal anti-inflammatories (NSAID): Secondary | ICD-10-CM | POA: Diagnosis not present

## 2017-06-05 HISTORY — PX: EXTRACORPOREAL SHOCK WAVE LITHOTRIPSY: SHX1557

## 2017-06-05 SURGERY — LITHOTRIPSY, ESWL
Anesthesia: Moderate Sedation | Laterality: Left

## 2017-06-05 MED ORDER — ONDANSETRON HCL 4 MG PO TABS: 4.0000 mg | ORAL_TABLET | Freq: Three times a day (TID) | ORAL | 0 refills | Status: DC | PRN

## 2017-06-05 MED ORDER — MIDAZOLAM HCL 2 MG/2ML IJ SOLN
1.0000 mg | Freq: Once | INTRAMUSCULAR | Status: AC
  Administered 2017-06-05: 1 mg via INTRAMUSCULAR

## 2017-06-05 MED ORDER — MORPHINE SULFATE (PF) 10 MG/ML IV SOLN
10.0000 mg | Freq: Once | INTRAVENOUS | Status: AC
  Administered 2017-06-05: 10 mg via INTRAMUSCULAR

## 2017-06-05 MED ORDER — DIPHENHYDRAMINE HCL 25 MG PO CAPS
25.0000 mg | ORAL_CAPSULE | ORAL | Status: AC
  Administered 2017-06-05: 25 mg via ORAL

## 2017-06-05 MED ORDER — PROMETHAZINE HCL 25 MG/ML IJ SOLN
INTRAMUSCULAR | Status: AC
  Administered 2017-06-05: 25 mg via INTRAMUSCULAR
  Filled 2017-06-05: qty 1

## 2017-06-05 MED ORDER — FUROSEMIDE 10 MG/ML IJ SOLN
10.0000 mg | Freq: Once | INTRAMUSCULAR | Status: AC
  Administered 2017-06-05: 10 mg via INTRAVENOUS

## 2017-06-05 MED ORDER — DIPHENHYDRAMINE HCL 25 MG PO CAPS
ORAL_CAPSULE | ORAL | Status: AC
  Filled 2017-06-05: qty 1

## 2017-06-05 MED ORDER — FUROSEMIDE 10 MG/ML IJ SOLN
INTRAMUSCULAR | Status: AC
  Administered 2017-06-05: 10 mg via INTRAVENOUS
  Filled 2017-06-05: qty 2

## 2017-06-05 MED ORDER — NUCYNTA 50 MG PO TABS: 50.0000 mg | ORAL_TABLET | Freq: Four times a day (QID) | ORAL | 0 refills | Status: DC | PRN

## 2017-06-05 MED ORDER — LEVOFLOXACIN 500 MG PO TABS
ORAL_TABLET | ORAL | Status: AC
  Filled 2017-06-05: qty 1

## 2017-06-05 MED ORDER — PROMETHAZINE HCL 25 MG/ML IJ SOLN
25.0000 mg | Freq: Once | INTRAMUSCULAR | Status: AC
  Administered 2017-06-05: 25 mg via INTRAMUSCULAR

## 2017-06-05 MED ORDER — DEXTROSE-NACL 5-0.45 % IV SOLN
INTRAVENOUS | Status: DC
  Administered 2017-06-05: 13:00:00 via INTRAVENOUS

## 2017-06-05 MED ORDER — MIDAZOLAM HCL 2 MG/2ML IJ SOLN
INTRAMUSCULAR | Status: AC
  Administered 2017-06-05: 1 mg via INTRAMUSCULAR
  Filled 2017-06-05: qty 2

## 2017-06-05 MED ORDER — TAMSULOSIN HCL 0.4 MG PO CAPS: 0.4000 mg | ORAL_CAPSULE | Freq: Every day | ORAL | 11 refills | Status: DC

## 2017-06-05 MED ORDER — MORPHINE SULFATE (PF) 10 MG/ML IV SOLN
INTRAVENOUS | Status: AC
  Administered 2017-06-05: 10 mg via INTRAMUSCULAR
  Filled 2017-06-05: qty 1

## 2017-06-05 MED ORDER — CIPROFLOXACIN HCL 500 MG PO TABS: 500.0000 mg | ORAL_TABLET | Freq: Two times a day (BID) | ORAL | 0 refills | Status: DC

## 2017-06-05 MED ORDER — LEVOFLOXACIN 500 MG PO TABS
500.0000 mg | ORAL_TABLET | ORAL | Status: AC
  Administered 2017-06-05: 500 mg via ORAL

## 2017-06-05 NOTE — Discharge Instructions (Signed)
Lithotripsy, Care After °This sheet gives you information about how to care for yourself after your procedure. Your health care provider may also give you more specific instructions. If you have problems or questions, contact your health care provider. °What can I expect after the procedure? °After the procedure, it is common to have: °· Some blood in your urine. This should only last for a few days. °· Soreness in your back, sides, or upper abdomen for a few days. °· Blotches or bruises on your back where the pressure wave entered the skin. °· Pain, discomfort, or nausea when pieces (fragments) of the kidney stone move through the tube that carries urine from the kidney to the bladder (ureter). Stone fragments may pass soon after the procedure, but they may continue to pass for up to 4-8 weeks. °? If you have severe pain or nausea, contact your health care provider. This may be caused by a large stone that was not broken up, and this may mean that you need more treatment. °· Some pain or discomfort during urination. °· Some pain or discomfort in the lower abdomen or (in men) at the base of the penis. ° °Follow these instructions at home: °Medicines °· Take over-the-counter and prescription medicines only as told by your health care provider. °· If you were prescribed an antibiotic medicine, take it as told by your health care provider. Do not stop taking the antibiotic even if you start to feel better. °· Do not drive for 24 hours if you were given a medicine to help you relax (sedative). °· Do not drive or use heavy machinery while taking prescription pain medicine. °Eating and drinking °· Drink enough water and fluids to keep your urine clear or pale yellow. This helps any remaining pieces of the stone to pass. It can also help prevent new stones from forming. °· Eat plenty of fresh fruits and vegetables. °· Follow instructions from your health care provider about eating and drinking restrictions. You may be  instructed: °? To reduce how much salt (sodium) you eat or drink. Check ingredients and nutrition facts on packaged foods and beverages. °? To reduce how much meat you eat. °· Eat the recommended amount of calcium for your age and gender. Ask your health care provider how much calcium you should have. °General instructions °· Get plenty of rest. °· Most people can resume normal activities 1-2 days after the procedure. Ask your health care provider what activities are safe for you. °· If directed, strain all urine through the strainer that was provided by your health care provider. °? Keep all fragments for your health care provider to see. Any stones that are found may be sent to a medical lab for examination. The stone may be as small as a grain of salt. °· Keep all follow-up visits as told by your health care provider. This is important. °Contact a health care provider if: °· You have pain that is severe or does not get better with medicine. °· You have nausea that is severe or does not go away. °· You have blood in your urine longer than your health care provider told you to expect. °· You have more blood in your urine. °· You have pain during urination that does not go away. °· You urinate more frequently than usual and this does not go away. °· You develop a rash or any other possible signs of an allergic reaction. °Get help right away if: °· You have severe pain in   your back, sides, or upper abdomen.  You have severe pain while urinating.  Your urine is very dark red.  You have blood in your stool (feces).  You cannot pass any urine at all.  You feel a strong urge to urinate after emptying your bladder.  You have a fever or chills.  You develop shortness of breath, difficulty breathing, or chest pain.  You have severe nausea that leads to persistent vomiting.  You faint. Summary  After this procedure, it is common to have some pain, discomfort, or nausea when pieces (fragments) of the  kidney stone move through the tube that carries urine from the kidney to the bladder (ureter). If this pain or nausea is severe, however, you should contact your health care provider.  Most people can resume normal activities 1-2 days after the procedure. Ask your health care provider what activities are safe for you.  Drink enough water and fluids to keep your urine clear or pale yellow. This helps any remaining pieces of the stone to pass, and it can help prevent new stones from forming.  If directed, strain your urine and keep all fragments for your health care provider to see. Fragments or stones may be as small as a grain of salt.  Get help right away if you have severe pain in your back, sides, or upper abdomen or have severe pain while urinating. This information is not intended to replace advice given to you by your health care provider. Make sure you discuss any questions you have with your health care provider. Document Released: 01/13/2007 Document Revised: 11/15/2015 Document Reviewed: 11/15/2015 Elsevier Interactive Patient Education  2018 [employer].   Kidney Stones Kidney stones (urolithiasis) are solid, rock-like deposits that form inside of the organs that make urine (kidneys). A kidney stone may form in a kidney and move into the bladder, where it can cause intense pain and block the flow of urine. Kidney stones are created when high levels of certain minerals are found in the urine. They are usually passed through urination, but in some cases, medical treatment may be needed to remove them. What are the causes? Kidney stones may be caused by:  A condition in which certain glands produce too much parathyroid hormone (primary hyperparathyroidism), which causes too much calcium buildup in the blood.  Buildup of uric acid crystals in the bladder (hyperuricosuria). Uric acid is a chemical that the body produces when you eat certain foods. It usually exits the body in the  urine.  Narrowing (stricture) of one or both of the tubes that drain urine from the kidneys to the bladder (ureters).  A kidney blockage that is present at birth (congenital obstruction).  Past surgery on the kidney or the ureters, such as gastric bypass surgery.  What increases the risk? The following factors make you more likely to develop kidney stones:  Having had a kidney stone in the past.  Having a family history of kidney stones.  Not drinking enough water.  Eating a diet that is high in protein, salt (sodium), or sugar.  Being overweight or obese.  What are the signs or symptoms? Symptoms of a kidney stone may include:  Nausea.  Vomiting.  Blood in the urine (hematuria).  Pain in the side of the abdomen, right below the ribs (flank pain). Pain usually spreads (radiates) to the groin.  Needing to urinate frequently or urgently.  How is this diagnosed? This condition may be diagnosed based on:  Your medical history.  A physical exam.  Blood tests.  Urine tests.  CT scan.  Abdominal X-ray.  A procedure to examine the inside of the bladder (cystoscopy).  How is this treated? Treatment for kidney stones depends on the size, location, and makeup of the stones. Treatment may involve:  Analyzing your urine before and after you pass the stone through urination.  Being monitored at the hospital until you pass the stone through urination.  Increasing your fluid intake and decreasing the amount of calcium and protein in your diet.  A procedure to break up kidney stones in the bladder using: ? A focused beam of light (laser therapy). ? Shock waves (extracorporeal shock wave lithotripsy).  Surgery to remove kidney stones. This may be needed if you have severe pain or have stones that block your urinary tract.  Follow these instructions at home: Eating and drinking   Drink enough fluid to keep your urine clear or pale yellow. This will help you to pass  the kidney stone.  If directed, change your diet. This may include: ? Limiting how much sodium you eat. ? Eating more fruits and vegetables. ? Limiting how much meat, poultry, fish, and eggs you eat.  Follow instructions from your health care provider about eating or drinking restrictions. General instructions  Collect urine samples as told by your health care provider. You may need to collect a urine sample: ? 24 hours after you pass the stone. ? 8-12 weeks after passing the kidney stone, and every 6-12 months after that.  Strain your urine every time you urinate, for as long as directed. Use the strainer that your health care provider recommends.  Do not throw out the kidney stone after passing it. Keep the stone so it can be tested by your health care provider. Testing the makeup of your kidney stone may help prevent you from getting kidney stones in the future.  Take over-the-counter and prescription medicines only as told by your health care provider.  Keep all follow-up visits as told by your health care provider. This is important. You may need follow-up X-rays or ultrasounds to make sure that your stone has passed. How is this prevented? To prevent another kidney stone:  Drink enough fluid to keep your urine clear or pale yellow. This is the best way to prevent kidney stones.  Eat a healthy diet and follow recommendations from your health care provider about foods to avoid. You may be instructed to eat a low-protein diet. Recommendations vary depending on the type of kidney stone that you have.  Maintain a healthy weight.  Contact a health care provider if:  You have pain that gets worse or does not get better with medicine. Get help right away if:  You have a fever or chills.  You develop severe pain.  You develop new abdominal pain.  You faint.  You are unable to urinate. This information is not intended to replace advice given to you by your health care  provider. Make sure you discuss any questions you have with your health care provider. Document Released: 12/24/2004 Document Revised: 07/14/2015 Document Reviewed: 06/09/2015 Elsevier Interactive Patient Education  [employer].

## 2017-06-06 ENCOUNTER — Encounter: Payer: Self-pay | Admitting: Urology

## 2018-09-29 ENCOUNTER — Other Ambulatory Visit: Payer: Self-pay | Admitting: *Deleted

## 2018-09-29 DIAGNOSIS — Z20822 Contact with and (suspected) exposure to covid-19: Secondary | ICD-10-CM

## 2018-10-01 LAB — NOVEL CORONAVIRUS, NAA: SARS-CoV-2, NAA: NOT DETECTED

## 2020-01-08 DIAGNOSIS — H348312 Tributary (branch) retinal vein occlusion, right eye, stable: Secondary | ICD-10-CM

## 2020-01-08 HISTORY — DX: Tributary (branch) retinal vein occlusion, right eye, stable: H34.8312

## 2020-03-17 ENCOUNTER — Telehealth (INDEPENDENT_AMBULATORY_CARE_PROVIDER_SITE_OTHER): Payer: Self-pay | Admitting: Gastroenterology

## 2020-03-17 ENCOUNTER — Other Ambulatory Visit: Payer: Self-pay

## 2020-03-17 DIAGNOSIS — R195 Other fecal abnormalities: Secondary | ICD-10-CM

## 2020-03-17 MED ORDER — NA SULFATE-K SULFATE-MG SULF 17.5-3.13-1.6 GM/177ML PO SOLN: 1.0000 | Freq: Once | ORAL | 0 refills | Status: AC

## 2020-03-17 NOTE — Progress Notes (Signed)
Gastroenterology Pre-Procedure Review  Request Date: Friday 04/07/20 Requesting Physician: Dr. Maximino Greenland  PATIENT REVIEW QUESTIONS: The patient responded to the following health history questions as indicated:    1. Are you having any GI issues? no 2. Do you have a personal history of Polyps? no 3. Do you have a family history of Colon Cancer or Polyps? no 4. Diabetes Mellitus? no 5. Joint replacements in the past 12 months?no 6. Major health problems in the past 3 months?no 7. Any artificial heart valves, MVP, or defibrillator?no    MEDICATIONS & ALLERGIES:    Patient reports the following regarding taking any anticoagulation/antiplatelet therapy:   Plavix, Coumadin, Eliquis, Xarelto, Lovenox, Pradaxa, Brilinta, or Effient? no Aspirin? no  Patient confirms/reports the following medications:  Current Outpatient Medications  Medication Sig Dispense Refill  . lisinopril (ZESTRIL) 20 MG tablet Take 20 mg by mouth 2 (two) times daily.    . tamsulosin (FLOMAX) 0.4 MG CAPS capsule Take 1 capsule (0.4 mg total) by mouth daily. (Patient not taking: Reported on 03/17/2020) 30 capsule 11   No current facility-administered medications for this visit.    Patient confirms/reports the following allergies:  Allergies  Allergen Reactions  . Penicillins Rash    No orders of the defined types were placed in this encounter.   AUTHORIZATION INFORMATION Primary Insurance: 1D#: Group #:  Secondary Insurance: 1D#: Group #:  SCHEDULE INFORMATION: Date: 04/07/20 Time: Location:[facility]

## 2020-03-27 ENCOUNTER — Telehealth: Payer: Self-pay

## 2020-03-27 NOTE — Telephone Encounter (Signed)
Patient called with questions regarding procedure claim. Pt states insurance is costing too much for the procedure because of the diagnoses. Suggested to patient call the referring physician to see if a new referral can be submitted. Pt verbalized understanding.

## 2020-03-29 ENCOUNTER — Telehealth: Payer: Self-pay

## 2020-03-29 NOTE — Telephone Encounter (Signed)
Returned patients call in regards to confirm colonoscopy cancellation.  He states that he will have to go to [facility] where he can make payment arrangements on his colonoscopy because [facility] wanted him to pay $4,000 up front which he is not able to do.  I confirmed that his procedure was canceled and told him I hope his colonoscopy goes well at [facility].  Thanks,  [place], [place]

## 2020-04-05 ENCOUNTER — Other Ambulatory Visit: Payer: BC Managed Care – PPO | Attending: Gastroenterology

## 2020-04-07 ENCOUNTER — Ambulatory Visit
Admission: RE | Admit: 2020-04-07 | Payer: BC Managed Care – PPO | Source: Home / Self Care | Admitting: Gastroenterology

## 2020-04-07 ENCOUNTER — Encounter: Admission: RE | Payer: Self-pay | Source: Home / Self Care

## 2020-04-07 SURGERY — COLONOSCOPY WITH PROPOFOL
Anesthesia: General

## 2020-04-10 ENCOUNTER — Encounter: Payer: Self-pay | Admitting: Internal Medicine

## 2020-05-31 MED ORDER — DEXTROSE-NACL 5-0.45 % IV SOLN: Freq: Once | INTRAVENOUS | Status: DC

## 2020-05-31 MED ORDER — PROMETHAZINE HCL 25 MG/ML IJ SOLN: 25.0000 mg | Freq: Once | INTRAMUSCULAR | Status: AC

## 2020-05-31 MED ORDER — DIPHENHYDRAMINE HCL 25 MG PO CAPS: 25.0000 mg | ORAL_CAPSULE | Freq: Once | ORAL | Status: AC

## 2020-05-31 MED ORDER — MIDAZOLAM HCL 2 MG/2ML IJ SOLN: 1.0000 mg | Freq: Once | INTRAMUSCULAR | Status: AC

## 2020-05-31 MED ORDER — MORPHINE SULFATE (PF) 2 MG/ML IV SOLN: 10.0000 mg | Freq: Once | INTRAVENOUS | Status: DC

## 2020-05-31 MED ORDER — LEVOFLOXACIN 500 MG PO TABS: 500.0000 mg | ORAL_TABLET | Freq: Once | ORAL | Status: AC

## 2020-05-31 MED ORDER — MORPHINE SULFATE (PF) 10 MG/ML IV SOLN: 10.0000 mg | Freq: Once | INTRAVENOUS | Status: AC

## 2020-06-01 ENCOUNTER — Encounter
Admission: RE | Disposition: A | Discharge status: Home / Self Care | Payer: Self-pay | Source: Home / Self Care | Attending: Urology

## 2020-06-01 ENCOUNTER — Other Ambulatory Visit: Payer: Self-pay

## 2020-06-01 ENCOUNTER — Encounter: Payer: Self-pay | Admitting: Urology

## 2020-06-01 ENCOUNTER — Ambulatory Visit
Admission: RE | Admit: 2020-06-01 | Discharge: 2020-06-01 | Disposition: A | Discharge status: Home / Self Care | Payer: BC Managed Care – PPO | Attending: Urology | Admitting: Urology

## 2020-06-01 DIAGNOSIS — E669 Obesity, unspecified: Secondary | ICD-10-CM | POA: Insufficient documentation

## 2020-06-01 DIAGNOSIS — Z880 Allergy status to penicillin: Secondary | ICD-10-CM | POA: Insufficient documentation

## 2020-06-01 DIAGNOSIS — Z6839 Body mass index (BMI) 39.0-39.9, adult: Secondary | ICD-10-CM | POA: Diagnosis not present

## 2020-06-01 DIAGNOSIS — I10 Essential (primary) hypertension: Secondary | ICD-10-CM | POA: Insufficient documentation

## 2020-06-01 DIAGNOSIS — N201 Calculus of ureter: Secondary | ICD-10-CM | POA: Insufficient documentation

## 2020-06-01 DIAGNOSIS — Z79899 Other long term (current) drug therapy: Secondary | ICD-10-CM | POA: Insufficient documentation

## 2020-06-01 HISTORY — PX: EXTRACORPOREAL SHOCK WAVE LITHOTRIPSY: SHX1557

## 2020-06-01 SURGERY — LITHOTRIPSY, ESWL
Anesthesia: Moderate Sedation | Laterality: Right

## 2020-06-01 MED ORDER — LEVOFLOXACIN 500 MG PO TABS
ORAL_TABLET | ORAL | Status: AC
  Administered 2020-06-01: 500 mg via ORAL
  Filled 2020-06-01: qty 1

## 2020-06-01 MED ORDER — FUROSEMIDE 10 MG/ML IJ SOLN
INTRAMUSCULAR | Status: AC
  Filled 2020-06-01: qty 2

## 2020-06-01 MED ORDER — HYDROCODONE-ACETAMINOPHEN 10-325 MG PO TABS: 1.0000 | ORAL_TABLET | Freq: Four times a day (QID) | ORAL | 0 refills | Status: DC | PRN

## 2020-06-01 MED ORDER — ONDANSETRON 8 MG PO TBDP: 8.0000 mg | ORAL_TABLET | Freq: Four times a day (QID) | ORAL | 3 refills | Status: DC | PRN

## 2020-06-01 MED ORDER — DIPHENHYDRAMINE HCL 25 MG PO CAPS
ORAL_CAPSULE | ORAL | Status: AC
  Administered 2020-06-01: 25 mg via ORAL
  Filled 2020-06-01: qty 1

## 2020-06-01 MED ORDER — MORPHINE SULFATE (PF) 10 MG/ML IV SOLN
INTRAVENOUS | Status: AC
  Administered 2020-06-01: 10 mg via INTRAMUSCULAR
  Filled 2020-06-01: qty 1

## 2020-06-01 MED ORDER — TAMSULOSIN HCL 0.4 MG PO CAPS: 0.4000 mg | ORAL_CAPSULE | Freq: Every day | ORAL | 1 refills | Status: DC

## 2020-06-01 MED ORDER — DOCUSATE SODIUM 100 MG PO CAPS: 200.0000 mg | ORAL_CAPSULE | Freq: Two times a day (BID) | ORAL | 3 refills | Status: DC

## 2020-06-01 MED ORDER — CIPROFLOXACIN HCL 500 MG PO TABS: 500.0000 mg | ORAL_TABLET | Freq: Two times a day (BID) | ORAL | 0 refills | Status: DC

## 2020-06-01 MED ORDER — FUROSEMIDE 10 MG/ML IJ SOLN
10.0000 mg | Freq: Once | INTRAMUSCULAR | Status: AC
  Administered 2020-06-01: 10 mg via INTRAVENOUS

## 2020-06-01 MED ORDER — PROMETHAZINE HCL 25 MG/ML IJ SOLN
INTRAMUSCULAR | Status: AC
  Administered 2020-06-01: 25 mg via INTRAMUSCULAR
  Filled 2020-06-01: qty 1

## 2020-06-01 MED ORDER — MIDAZOLAM HCL 2 MG/2ML IJ SOLN
INTRAMUSCULAR | Status: AC
  Administered 2020-06-01: 1 mg via INTRAMUSCULAR
  Filled 2020-06-01: qty 2

## 2020-06-01 NOTE — Discharge Instructions (Addendum)
AMBULATORY SURGERY  DISCHARGE INSTRUCTIONS   1) The drugs that you were given will stay in your system until tomorrow so for the next 24 hours you should not:  A) Drive an automobile B) Make any legal decisions C) Drink any alcoholic beverage   2) You may resume regular meals tomorrow.  Today it is better to start with liquids and gradually work up to solid foods.  You may eat anything you prefer, but it is better to start with liquids, then soup and crackers, and gradually work up to solid foods.   3) Please notify your doctor immediately if you have any unusual bleeding, trouble breathing, redness and pain at the surgery site, drainage, fever, or pain not relieved by medication.    4) Additional Instructions:        Please contact your physician with any problems or Same Day Surgery at [phone], Monday through Friday 6 am to 4 pm, or [place] [facility] number at [phone].Lithotripsy, Care After This sheet gives you information about how to care for yourself after your procedure. Your health care provider may also give you more specific instructions. If you have problems or questions, contact your health care provider. What can I expect after the procedure? After the procedure, it is common to have:  Some blood in your urine. This should only last for a few days.  Soreness in your back, sides, or upper abdomen for a few days.  Blotches or bruises on the area where the shock wave entered the skin.  Pain, discomfort, or nausea when pieces (fragments) of the kidney stone move through the tube that carries urine from the kidney to the bladder (ureter). Stone fragments may pass soon after the procedure, but they may continue to pass for up to 4-8 weeks. ? If you have severe pain or nausea, contact your health care provider. This may be caused by a large stone that was not broken up, and this may mean that you need more treatment.  Some pain or discomfort during  urination.  Some pain or discomfort in the lower abdomen or (in men) at the base of the penis. Follow these instructions at home: Medicines  Take over-the-counter and prescription medicines only as told by your health care provider.  If you were prescribed an antibiotic medicine, take it as told by your health care provider. Do not stop taking the antibiotic even if you start to feel better.  Ask your health care provider if the medicine prescribed to you requires you to avoid driving or using machinery. Eating and drinking  Drink enough fluid to keep your urine pale yellow. This helps any remaining pieces of the stone to pass. It can also help prevent new stones from forming.  Eat plenty of fresh fruits and vegetables.  Follow instructions from your health care provider about eating or drinking restrictions. You may be instructed to: ? Reduce how much salt (sodium) you eat or drink. Check ingredients and nutrition facts on packaged foods and beverages to see how much sodium they contain. ? Reduce how much meat you eat.  Eat the recommended amount of calcium for your age and gender. Ask your health care provider how much calcium you should have.      General instructions  Get plenty of rest.  Return to your normal activities as told by your health care provider. Ask your health care provider what activities are safe for you. Most people can resume normal activities 1-2 days after  the procedure.  If you were given a sedative during the procedure, it can affect you for several hours. Do not drive or operate machinery until your health care provider says that it is safe.  Your health care provider may direct you to lie in a certain position (postural drainage) and tap firmly (percuss) over your kidney area to help stone fragments pass. Follow instructions as told by your health care provider.  If directed, strain all urine through the strainer that was provided by your health care  provider. ? Keep all fragments for your health care provider to see. Any stones that are found may be sent to a medical lab for examination. The stone may be as small as a grain of salt.  Keep all follow-up visits as told by your health care provider. This is important. Contact a health care provider if:  You have a fever or chills.  You have nausea that is severe or does not go away.  You have any of these urinary symptoms: ? Blood in your urine for longer than your health care provider told you to expect. ? Urine that smells bad or unusual. ? Feeling a strong urge to urinate after emptying your bladder. ? Pain or burning with urination that does not go away. ? Urinating more often than usual and this does not go away.  You have a stent and it comes out. Get help right away if:  You have severe pain in your back, sides, or upper abdomen.  You have any of these urinary symptoms: ? Severe pain while urinating. ? More blood in your urine or having blood in your urine when you did not before. ? Passing blood clots in your urine. ? Passing only a small amount of urine or being unable to pass any urine at all.  You have severe nausea that leads to persistent vomiting.  You faint. Summary  After this procedure, it is common to have some pain, discomfort, or nausea when pieces (fragments) of the kidney stone move through the tube that carries urine from the kidney to the bladder (ureter). If this pain or nausea is severe, however, you should contact your health care provider.  Return to your normal activities as told by your health care provider. Ask your health care provider what activities are safe for you.  Drink enough fluid to keep your urine pale yellow. This helps any remaining pieces of the stone to pass, and it can help prevent new stones from forming.  If directed, strain your urine and keep all fragments for your health care provider to see. Fragments or stones may be as  small as a grain of salt.  Get help right away if you have severe pain in your back, sides, or upper abdomen, or if you have severe pain while urinating. This information is not intended to replace advice given to you by your health care provider. Make sure you discuss any questions you have with your health care provider. Document Revised: 10/07/2018 Document Reviewed: 10/07/2018 Elsevier Patient Education  2021 [employer]  

## 2020-06-02 ENCOUNTER — Encounter: Payer: Self-pay | Admitting: Urology

## 2020-06-28 MED ORDER — DEXTROSE-NACL 5-0.45 % IV SOLN: INTRAVENOUS | Status: DC

## 2020-06-28 MED ORDER — PROMETHAZINE HCL 25 MG/ML IJ SOLN: 25.0000 mg | INTRAMUSCULAR | Status: AC

## 2020-06-28 MED ORDER — MIDAZOLAM HCL 2 MG/2ML IJ SOLN: 1.0000 mg | INTRAMUSCULAR | Status: AC

## 2020-06-28 MED ORDER — DIPHENHYDRAMINE HCL 25 MG PO CAPS
25.0000 mg | ORAL_CAPSULE | ORAL | Status: AC
  Administered 2020-06-29: 25 mg via ORAL

## 2020-06-28 MED ORDER — LEVOFLOXACIN 500 MG PO TABS
500.0000 mg | ORAL_TABLET | ORAL | Status: AC
  Administered 2020-06-29: 500 mg via ORAL

## 2020-06-28 MED ORDER — MORPHINE SULFATE (PF) 10 MG/ML IV SOLN: 10.0000 mg | INTRAVENOUS | Status: AC

## 2020-06-29 ENCOUNTER — Ambulatory Visit
Admission: RE | Admit: 2020-06-29 | Discharge: 2020-06-29 | Disposition: A | Discharge status: Home / Self Care | Payer: BC Managed Care – PPO | Attending: Urology | Admitting: Urology

## 2020-06-29 ENCOUNTER — Encounter: Payer: Self-pay | Admitting: Urology

## 2020-06-29 ENCOUNTER — Encounter
Admission: RE | Disposition: A | Discharge status: Home / Self Care | Payer: Self-pay | Source: Home / Self Care | Attending: Urology

## 2020-06-29 DIAGNOSIS — N200 Calculus of kidney: Secondary | ICD-10-CM | POA: Insufficient documentation

## 2020-06-29 DIAGNOSIS — Z79899 Other long term (current) drug therapy: Secondary | ICD-10-CM | POA: Insufficient documentation

## 2020-06-29 DIAGNOSIS — I10 Essential (primary) hypertension: Secondary | ICD-10-CM | POA: Diagnosis not present

## 2020-06-29 DIAGNOSIS — Z880 Allergy status to penicillin: Secondary | ICD-10-CM | POA: Insufficient documentation

## 2020-06-29 HISTORY — PX: EXTRACORPOREAL SHOCK WAVE LITHOTRIPSY: SHX1557

## 2020-06-29 SURGERY — LITHOTRIPSY, ESWL
Anesthesia: Moderate Sedation | Laterality: Left

## 2020-06-29 MED ORDER — FUROSEMIDE 10 MG/ML IJ SOLN
INTRAMUSCULAR | Status: AC
  Filled 2020-06-29: qty 2

## 2020-06-29 MED ORDER — PROMETHAZINE HCL 25 MG/ML IJ SOLN
INTRAMUSCULAR | Status: AC
  Administered 2020-06-29: 25 mg via INTRAMUSCULAR
  Filled 2020-06-29: qty 1

## 2020-06-29 MED ORDER — MIDAZOLAM HCL 2 MG/2ML IJ SOLN
INTRAMUSCULAR | Status: AC
  Administered 2020-06-29: 1 mg via INTRAMUSCULAR
  Filled 2020-06-29: qty 2

## 2020-06-29 MED ORDER — FUROSEMIDE 10 MG/ML IJ SOLN
10.0000 mg | Freq: Once | INTRAMUSCULAR | Status: AC
  Administered 2020-06-29: 10 mg via INTRAVENOUS

## 2020-06-29 MED ORDER — DIPHENHYDRAMINE HCL 25 MG PO CAPS
ORAL_CAPSULE | ORAL | Status: AC
  Filled 2020-06-29: qty 1

## 2020-06-29 MED ORDER — MORPHINE SULFATE (PF) 10 MG/ML IV SOLN
INTRAVENOUS | Status: AC
  Administered 2020-06-29: 10 mg via INTRAMUSCULAR
  Filled 2020-06-29: qty 1

## 2020-06-29 MED ORDER — LEVOFLOXACIN 500 MG PO TABS
ORAL_TABLET | ORAL | Status: AC
  Filled 2020-06-29: qty 1

## 2020-06-29 NOTE — Discharge Instructions (Signed)

## 2020-06-30 ENCOUNTER — Encounter: Payer: Self-pay | Admitting: Urology

## 2021-08-30 ENCOUNTER — Encounter
Admission: RE | Disposition: A | Discharge status: Home / Self Care | Payer: Self-pay | Source: Home / Self Care | Attending: Urology

## 2021-08-30 ENCOUNTER — Ambulatory Visit
Admission: RE | Admit: 2021-08-30 | Discharge: 2021-08-30 | Disposition: A | Discharge status: Home / Self Care | Payer: BC Managed Care – PPO | Attending: Urology | Admitting: Urology

## 2021-08-30 ENCOUNTER — Other Ambulatory Visit: Payer: Self-pay

## 2021-08-30 ENCOUNTER — Encounter: Payer: Self-pay | Admitting: Urology

## 2021-08-30 DIAGNOSIS — N200 Calculus of kidney: Secondary | ICD-10-CM | POA: Diagnosis present

## 2021-08-30 HISTORY — PX: EXTRACORPOREAL SHOCK WAVE LITHOTRIPSY: SHX1557

## 2021-08-30 SURGERY — LITHOTRIPSY, ESWL
Anesthesia: Moderate Sedation | Laterality: Left

## 2021-08-30 MED ORDER — MORPHINE SULFATE (PF) 10 MG/ML IV SOLN
INTRAVENOUS | Status: AC
  Administered 2021-08-30: 10 mg via INTRAMUSCULAR
  Filled 2021-08-30: qty 1

## 2021-08-30 MED ORDER — CIPROFLOXACIN HCL 500 MG PO TABS: 500.0000 mg | ORAL_TABLET | Freq: Two times a day (BID) | ORAL | 0 refills | Status: DC

## 2021-08-30 MED ORDER — FUROSEMIDE 10 MG/ML IJ SOLN: 10.0000 mg | Freq: Once | INTRAMUSCULAR | Status: AC

## 2021-08-30 MED ORDER — MIDAZOLAM HCL 2 MG/2ML IJ SOLN: 1.0000 mg | Freq: Once | INTRAMUSCULAR | Status: AC

## 2021-08-30 MED ORDER — DIPHENHYDRAMINE HCL 25 MG PO CAPS: 25.0000 mg | ORAL_CAPSULE | Freq: Four times a day (QID) | ORAL | Status: DC | PRN

## 2021-08-30 MED ORDER — PROMETHAZINE HCL 25 MG/ML IJ SOLN: 25.0000 mg | Freq: Once | INTRAMUSCULAR | Status: AC

## 2021-08-30 MED ORDER — DIPHENHYDRAMINE HCL 25 MG PO CAPS
ORAL_CAPSULE | ORAL | Status: AC
  Administered 2021-08-30: 25 mg via ORAL
  Filled 2021-08-30: qty 1

## 2021-08-30 MED ORDER — MIDAZOLAM HCL 2 MG/2ML IJ SOLN
INTRAMUSCULAR | Status: AC
  Administered 2021-08-30: 1 mg via INTRAMUSCULAR
  Filled 2021-08-30: qty 2

## 2021-08-30 MED ORDER — DEXTROSE-NACL 5-0.45 % IV SOLN: INTRAVENOUS | Status: DC

## 2021-08-30 MED ORDER — FUROSEMIDE 10 MG/ML IJ SOLN
INTRAMUSCULAR | Status: AC
  Administered 2021-08-30: 10 mg via INTRAVENOUS
  Filled 2021-08-30: qty 2

## 2021-08-30 MED ORDER — LEVOFLOXACIN 500 MG PO TABS: 500.0000 mg | ORAL_TABLET | Freq: Once | ORAL | Status: AC

## 2021-08-30 MED ORDER — LEVOFLOXACIN 500 MG PO TABS
ORAL_TABLET | ORAL | Status: AC
  Administered 2021-08-30: 500 mg via ORAL
  Filled 2021-08-30: qty 1

## 2021-08-30 MED ORDER — MORPHINE SULFATE (PF) 10 MG/ML IV SOLN: 10.0000 mg | Freq: Once | INTRAVENOUS | Status: AC

## 2021-08-30 MED ORDER — PROMETHAZINE HCL 25 MG/ML IJ SOLN
INTRAMUSCULAR | Status: AC
  Administered 2021-08-30: 25 mg via INTRAMUSCULAR
  Filled 2021-08-30: qty 1

## 2021-08-30 NOTE — Discharge Instructions (Signed)
Lithotripsy, Care After ?This sheet gives you information about how to care for yourself after your procedure. Your health care provider may also give you more specific instructions. If you have problems or questions, contact your health care provider. ?What can I expect after the procedure? ?After the procedure, it is common to have: ?Some blood in your urine. This should only last for a few days. ?Soreness in your back, sides, or upper abdomen for a few days. ?Blotches or bruises on the area where the shock wave entered the skin. ?Pain, discomfort, or nausea when pieces (fragments) of the kidney stone move through the tube that carries urine from the kidney to the bladder (ureter). Stone fragments may pass soon after the procedure, but they may continue to pass for up to 4-8 weeks. ?If you have severe pain or nausea, contact your health care provider. This may be caused by a large stone that was not broken up, and this may mean that you need more treatment. ?Some pain or discomfort during urination. ?Some pain or discomfort in the lower abdomen or (in men) at the base of the penis. ?Follow these instructions at home: ?Medicines ?Take over-the-counter and prescription medicines only as told by your health care provider. ?If you were prescribed an antibiotic medicine, take it as told by your health care provider. Do not stop taking the antibiotic even if you start to feel better. ?Ask your health care provider if the medicine prescribed to you requires you to avoid driving or using machinery. ?Eating and drinking ?A comparison of three sample cups showing dark yellow, yellow, and pale yellow urine. ? ?  ?A plate with examples of foods in a healthy diet. ? ?Drink enough fluid to keep your urine pale yellow. This helps any remaining pieces of the stone to pass. It can also help prevent new stones from forming. ?Eat plenty of fresh fruits and vegetables. ?Follow instructions from your health care provider about eating  or drinking restrictions. You may be instructed to: ?Reduce how much salt (sodium) you eat or drink. Check ingredients and nutrition facts on packaged foods and beverages to see how much sodium they contain. ?Reduce how much meat you eat. ?Eat the recommended amount of calcium for your age and gender. Ask your health care provider how much calcium you should have. ?General instructions ?Get plenty of rest. ?Return to your normal activities as told by your health care provider. Ask your health care provider what activities are safe for you. Most people can resume normal activities 1-2 days after the procedure. ?If you were given a sedative during the procedure, it can affect you for several hours. Do not drive or operate machinery until your health care provider says that it is safe. ?Your health care provider may direct you to lie in a certain position (postural drainage) and tap firmly (percuss) over your kidney area to help stone fragments pass. Follow instructions as told by your health care provider. ?If directed, strain all urine through the strainer that was provided by your health care provider. ?Keep all fragments for your health care provider to see. Any stones that are found may be sent to a medical lab for examination. The stone may be as small as a grain of salt. ?Keep all follow-up visits as told by your health care provider. This is important. ?Contact a health care provider if: ?You have a fever or chills. ?You have nausea that is severe or does not go away. ?You have any of these   urinary symptoms: ?Blood in your urine for longer than your health care provider told you to expect. ?Urine that smells bad or unusual. ?Feeling a strong urge to urinate after emptying your bladder. ?Pain or burning with urination that does not go away. ?Urinating more often than usual and this does not go away. ?You have a stent and it comes out. ?Get help right away if: ?You have severe pain in your back, sides, or upper  abdomen. ?You have any of these urinary symptoms: ?Severe pain while urinating. ?More blood in your urine or having blood in your urine when you did not before. ?Passing blood clots in your urine. ?Passing only a small amount of urine or being unable to pass any urine at all. ?You have severe nausea that leads to persistent vomiting. ?You faint. ?Summary ?After this procedure, it is common to have some pain, discomfort, or nausea when pieces (fragments) of the kidney stone move through the tube that carries urine from the kidney to the bladder (ureter). If this pain or nausea is severe, however, you should contact your health care provider. ?Return to your normal activities as told by your health care provider. Ask your health care provider what activities are safe for you. ?Drink enough fluid to keep your urine pale yellow. This helps any remaining pieces of the stone to pass, and it can help prevent new stones from forming. ?If directed, strain your urine and keep all fragments for your health care provider to see. Fragments or stones may be as small as a grain of salt. ?Get help right away if you have severe pain in your back, sides, or upper abdomen, or if you have severe pain while urinating. ?This information is not intended to replace advice given to you by your health care provider. Make sure you discuss any questions you have with your health care provider. ?Document Revised: 11/20/2020 Document Reviewed: 08/28/2020 ?Elsevier Patient Education ? 2023 [employer] ?   ? ?

## 2021-08-31 ENCOUNTER — Encounter: Payer: Self-pay | Admitting: Urology

## 2021-10-11 ENCOUNTER — Ambulatory Visit: Payer: BC Managed Care – PPO | Admitting: Podiatry

## 2021-10-15 ENCOUNTER — Ambulatory Visit (INDEPENDENT_AMBULATORY_CARE_PROVIDER_SITE_OTHER): Payer: BC Managed Care – PPO

## 2021-10-15 ENCOUNTER — Encounter: Payer: Self-pay | Admitting: Podiatry

## 2021-10-15 ENCOUNTER — Ambulatory Visit: Payer: BC Managed Care – PPO | Admitting: Podiatry

## 2021-10-15 ENCOUNTER — Other Ambulatory Visit: Payer: Self-pay | Admitting: Podiatry

## 2021-10-15 DIAGNOSIS — M722 Plantar fascial fibromatosis: Secondary | ICD-10-CM

## 2021-10-15 DIAGNOSIS — M778 Other enthesopathies, not elsewhere classified: Secondary | ICD-10-CM

## 2021-10-15 DIAGNOSIS — N200 Calculus of kidney: Secondary | ICD-10-CM | POA: Insufficient documentation

## 2021-10-15 MED ORDER — TRIAMCINOLONE ACETONIDE 40 MG/ML IJ SUSP
40.0000 mg | Freq: Once | INTRAMUSCULAR | Status: AC
  Administered 2021-10-15: 40 mg

## 2021-10-15 MED ORDER — METHYLPREDNISOLONE 4 MG PO TBPK: ORAL_TABLET | ORAL | 0 refills | Status: DC

## 2021-10-15 NOTE — Progress Notes (Signed)
Subjective:  Patient ID: Roy Leonidas Romberg., adult    DOB: 05/28/1970,  MRN: [id number] HPI Chief Complaint  Patient presents with   Foot Pain    Plantar heel bilateral (R>L) - aching x 1 years, AM pain, had EPAT done by Dr. Charlsie Merles years ago, tried Tylenol, no NSAIDS due to kidney stones   New Patient (Initial Visit)    51 y.o. adult presents with the above complaint.   ROS: Denies fever chills nausea vomiting muscle aches pains calf pain back pain chest pain shortness of breath.  Past Medical History:  Diagnosis Date   Medical history non-contributory    Past Surgical History:  Procedure Laterality Date   EXTRACORPOREAL SHOCK WAVE LITHOTRIPSY Bilateral 2000,2010,2012   EXTRACORPOREAL SHOCK WAVE LITHOTRIPSY Left 07/28/2014   Procedure: EXTRACORPOREAL SHOCK WAVE LITHOTRIPSY (ESWL);  Surgeon: Orson Ape, MD;  Location: [facility];  Service: Urology;  Laterality: Left;   EXTRACORPOREAL SHOCK WAVE LITHOTRIPSY Left 12/07/2015   Procedure: EXTRACORPOREAL SHOCK WAVE LITHOTRIPSY (ESWL);  Surgeon: Orson Ape, MD;  Location: [facility];  Service: Urology;  Laterality: Left;   EXTRACORPOREAL SHOCK WAVE LITHOTRIPSY Left 06/05/2017   Procedure: EXTRACORPOREAL SHOCK WAVE LITHOTRIPSY (ESWL);  Surgeon: Orson Ape, MD;  Location: [facility];  Service: Urology;  Laterality: Left;   EXTRACORPOREAL SHOCK WAVE LITHOTRIPSY Right 06/01/2020   Procedure: EXTRACORPOREAL SHOCK WAVE LITHOTRIPSY (ESWL);  Surgeon: Orson Ape, MD;  Location: [facility];  Service: Urology;  Laterality: Right;   EXTRACORPOREAL SHOCK WAVE LITHOTRIPSY Left 06/29/2020   Procedure: EXTRACORPOREAL SHOCK WAVE LITHOTRIPSY (ESWL);  Surgeon: Orson Ape, MD;  Location: [facility];  Service: Urology;  Laterality: Left;   EXTRACORPOREAL SHOCK WAVE LITHOTRIPSY Left 08/30/2021   Procedure: EXTRACORPOREAL SHOCK WAVE LITHOTRIPSY (ESWL);  Surgeon: Orson Ape, MD;  Location: [facility];  Service: Urology;  Laterality: Left;     Current Outpatient Medications:    methylPREDNISolone (MEDROL DOSEPAK) 4 MG TBPK tablet, 6 day dose pack - take as directed, Disp: 21 tablet, Rfl: 0   lisinopril (ZESTRIL) 20 MG tablet, Take 20 mg by mouth 2 (two) times daily., Disp: , Rfl:   Allergies  Allergen Reactions   Penicillins Rash   Review of Systems Objective:  There were no vitals filed for this visit.  General: Well developed, nourished, in no acute distress, alert and oriented x3   Dermatological: Skin is warm, dry and supple bilateral. Nails x 10 are well maintained; remaining integument appears unremarkable at this time. There are no open sores, no preulcerative lesions, no rash or signs of infection present.  Vascular: Dorsalis Pedis artery and Posterior Tibial artery pedal pulses are 2/4 bilateral with immedate capillary fill time. Pedal hair growth present. No varicosities and no lower extremity edema present bilateral.   Neruologic: Grossly intact via light touch bilateral. Vibratory intact via tuning fork bilateral. Protective threshold with Semmes Wienstein monofilament intact to all pedal sites bilateral. Patellar and Achilles deep tendon reflexes 2+ bilateral. No Babinski or clonus noted bilateral.   Musculoskeletal: No gross boney pedal deformities bilateral. No pain, crepitus, or limitation noted with foot and ankle range of motion bilateral. Muscular strength 5/5 in all groups tested bilateral.  Pain on palpation medial patellar tubercles bilateral.  Gait: Unassisted, Nonantalgic.    Radiographs:  Radiographs taken today demonstrate an osseously mature individual plantar distally oriented to Caney heel spur soft tissue increase in density plantar fascial Caney insertion site.  No acute findings noted.  Assessment & Plan:   Assessment:  Planter fasciitis bilateral.  Plan: Injected the bilateral heels today 20 mg Kenalog 5 mg Marcaine point maximal tenderness.  Started him on methylprednisolone in  bilateral plantar fascia braces.  Discussed appropriate shoe gear stretching exercise ice therapy sugar modifications follow-up with him in 1 month     Rosell Khouri T. [place], [place]

## 2021-10-15 NOTE — Patient Instructions (Signed)

## 2021-11-12 ENCOUNTER — Encounter: Payer: Self-pay | Admitting: Podiatry

## 2021-11-12 ENCOUNTER — Ambulatory Visit: Payer: BC Managed Care – PPO | Admitting: Podiatry

## 2021-11-12 DIAGNOSIS — M722 Plantar fascial fibromatosis: Secondary | ICD-10-CM

## 2021-11-12 MED ORDER — TRIAMCINOLONE ACETONIDE 40 MG/ML IJ SUSP
40.0000 mg | Freq: Once | INTRAMUSCULAR | Status: AC
  Administered 2021-11-12: 40 mg

## 2021-11-12 NOTE — Progress Notes (Signed)
He presents today for follow-up of his Planter fasciitis bilaterally.  He states that he is considerably better at least he can walk walk now.  Objective: Vital signs stable he is alert oriented x3 status pain on palpation medial calcaneal tubercles bilateral.  Right worse than the left.  Assessment: Plan fasciitis bilateral.  Plan: Reinjected bilateral heels today.  He was also casted for orthotics.  I will follow-up with him in 1 month or so.

## 2021-12-18 ENCOUNTER — Ambulatory Visit (INDEPENDENT_AMBULATORY_CARE_PROVIDER_SITE_OTHER): Payer: BC Managed Care – PPO | Admitting: Podiatry

## 2021-12-18 DIAGNOSIS — M722 Plantar fascial fibromatosis: Secondary | ICD-10-CM

## 2021-12-18 NOTE — Progress Notes (Signed)
Patient presents today to pick up custom molded foot orthotics recommended by Dr. Al Corpus.   Orthotics were dispensed and fit was satisfactory. Reviewed instructions for break-in and wear. Written instructions given to patient.  Patient will follow up as needed.   [facility] - order # [id number]

## 2023-11-19 ENCOUNTER — Ambulatory Visit: Admitting: Family Medicine

## 2023-11-19 ENCOUNTER — Encounter: Payer: Self-pay | Admitting: Family Medicine

## 2023-11-19 VITALS — BP 113/74 | HR 88 | Temp 98.2°F | Ht 74.0 in | Wt 296.9 lb

## 2023-11-19 DIAGNOSIS — H348312 Tributary (branch) retinal vein occlusion, right eye, stable: Secondary | ICD-10-CM | POA: Diagnosis not present

## 2023-11-19 DIAGNOSIS — Z7689 Persons encountering health services in other specified circumstances: Secondary | ICD-10-CM

## 2023-11-19 DIAGNOSIS — I10 Essential (primary) hypertension: Secondary | ICD-10-CM | POA: Insufficient documentation

## 2023-11-19 DIAGNOSIS — Z87442 Personal history of urinary calculi: Secondary | ICD-10-CM | POA: Diagnosis not present

## 2023-11-19 NOTE — Progress Notes (Signed)
 New patient visit   Patient: Roy Haley.   DOB: 01-06-1971   53 y.o. Male  MRN: [id number] Visit Date: 11/19/2023  Today's healthcare provider: LAURAINE LOISE BUOY, DO   Chief Complaint  Patient presents with   New Patient (Initial Visit)    Patient is here to establlished with a primary provider.  Vaccines declined.   Subjective    Roy Haley. is a 53 y.o. male who presents today as a new patient to establish care.  HPI HPI     New Patient (Initial Visit)    Additional comments: Patient is here to establlished with a primary provider.  Vaccines declined.      Last edited by Terrel Powell CROME, CMA on 11/19/2023  2:06 PM.       Roy Haley is a 53 year old male who presents for establishing care and routine follow-up.  He has a history of kidney stones, with the last occurrence two years ago in October. The pain is severe, often causing significant discomfort, particularly at night. He has undergone multiple lithotripsy procedures and has experienced instances where the pain was misdiagnosed due to its location. He prefers to avoid CT scans.  He has a history of a vein occlusion in his right eye, which led to the initiation of lisinopril treatment. His blood pressure was previously borderline with a diastolic reading around ninety, but lisinopril has helped maintain it in the low eighties or seventies. He is currently taking lisinopril 20 mg.  He takes sildenafil, which he obtains from a local pharmacy without insurance processing. He is unsure of the exact dosage but mentions cutting the tablets in half as per previous advice.  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 He has not been screened for hepatitis C or HIV and expresses no current interest in additional vaccinations such as the flu or pneumonia vaccines.  He mentions a past experience with a false  positive Cologuard test, which led to a colonoscopy. He is up to date with his colonoscopy screenings, with the next one due in 2032.    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, unspecified complication status (HCC) 2022   Kidney stones    Medical history non-contributory    Past Surgical History:  Procedure Laterality Date   EXTRACORPOREAL SHOCK WAVE LITHOTRIPSY Bilateral 2000,2010,2012   EXTRACORPOREAL SHOCK WAVE LITHOTRIPSY Left 07/28/2014   Procedure: EXTRACORPOREAL SHOCK WAVE LITHOTRIPSY (ESWL);  Surgeon: Ozell JONELLE Burkes, MD;  Location: [facility];  Service: Urology;  Laterality: Left;   EXTRACORPOREAL SHOCK WAVE LITHOTRIPSY Left 12/07/2015   Procedure: EXTRACORPOREAL SHOCK WAVE LITHOTRIPSY (ESWL);  Surgeon: Ozell JONELLE Burkes, MD;  Location: [facility];  Service: Urology;  Laterality: Left;   EXTRACORPOREAL SHOCK WAVE LITHOTRIPSY Left 06/05/2017   Procedure: EXTRACORPOREAL SHOCK WAVE LITHOTRIPSY (ESWL);  Surgeon: Burkes Ozell JONELLE, MD;  Location: [facility];  Service: Urology;  Laterality: Left;   EXTRACORPOREAL SHOCK WAVE LITHOTRIPSY Right 06/01/2020   Procedure: EXTRACORPOREAL SHOCK WAVE LITHOTRIPSY (ESWL);  Surgeon: Burkes Ozell JONELLE, MD;  Location: [facility];  Service: Urology;  Laterality: Right;   EXTRACORPOREAL SHOCK WAVE LITHOTRIPSY Left 06/29/2020   Procedure: EXTRACORPOREAL SHOCK WAVE LITHOTRIPSY (ESWL);  Surgeon: Burkes Ozell JONELLE, MD;  Location: [facility];  Service: Urology;  Laterality: Left;   EXTRACORPOREAL SHOCK WAVE LITHOTRIPSY Left 08/30/2021   Procedure: EXTRACORPOREAL SHOCK WAVE  LITHOTRIPSY (ESWL);  Surgeon: Kassie Ozell SAUNDERS, MD;  Location: [facility];  Service: Urology;  Laterality: Left;   No family status information on file.   History reviewed. No pertinent family history. Social History   Socioeconomic History   Marital status: Married    Spouse name: Not on file   Number of children: Not on file   Years of education: Not on file    Highest education level: 12th grade  Occupational History   Not on file  Tobacco Use   Smoking status: Never   Smokeless tobacco: Never  Vaping Use   Vaping status: Never Used  Substance and Sexual Activity   Alcohol use: No   Drug use: No   Sexual activity: Yes    Birth control/protection: None  Other Topics Concern   Not on file  Social History Narrative   Not on file   Social Drivers of Health   Financial Resource Strain: Low Risk  (11/18/2023)   Overall Financial Resource Strain (CARDIA)    Difficulty of Paying Living Expenses: Not very hard  Food Insecurity: No Food Insecurity (11/18/2023)   Hunger Vital Sign    Worried About Running Out of Food in the Last Year: Never true    Ran Out of Food in the Last Year: Never true  Transportation Needs: No Transportation Needs (11/18/2023)   PRAPARE - Administrator, Civil Service (Medical): No    Lack of Transportation (Non-Medical): No  Physical Activity: Insufficiently Active (11/18/2023)   Exercise Vital Sign    Days of Exercise per Week: 6 days    Minutes of Exercise per Session: 20 min  Stress: No Stress Concern Present (11/18/2023)   [employer] of Occupational Health - Occupational Stress Questionnaire    Feeling of Stress: Not at all  Social Connections: Unknown (11/18/2023)   Social Connection and Isolation Panel    Frequency of Communication with Friends and Family: More than three times a week    Frequency of Social Gatherings with Friends and Family: More than three times a week    Attends Religious Services: More than 4 times per year    Active Member of [employer] or Organizations: Patient declined    Attends Banker Meetings: Not on file    Marital Status: Married   Outpatient Medications Prior to Visit  Medication Sig   lisinopril (ZESTRIL) 20 MG tablet Take 20 mg by mouth 2 (two) times daily.   sildenafil (VIAGRA) 100 MG tablet Take 50-100 mg by mouth as needed for erectile  dysfunction.   [DISCONTINUED] sildenafil (VIAGRA) 25 MG tablet Take 25 mg by mouth daily as needed for erectile dysfunction (Only gets 10 prescribed and was told to break them in half.).   No facility-administered medications prior to visit.   Allergies  Allergen Reactions   Penicillins Rash    Immunization History  Administered Date(s) Administered   Tdap 06/04/2017    Health Maintenance  Topic Date Due   Hepatitis C Screening  01/08/2024 (Originally 01/21/1987)   HIV Screening  01/08/2024 (Originally 01/21/1984)   Influenza Vaccine  04/06/2024 (Originally 08/08/2023)   Zoster Vaccines- Shingrix (1 of 2) 06/07/2024 (Originally 01/21/2019)   COVID-19 Vaccine (1 - 2025-26 season) 09/07/2024 (Originally 09/08/2023)   Pneumococcal Vaccine: 50+ Years (1 of 1 - PCV) 11/18/2024 (Originally 01/21/2019)   DTaP/Tdap/Td (2 - Td or Tdap) 06/05/2027   Colonoscopy  07/06/2030   HPV VACCINES  Aged Out   Meningococcal B Vaccine  Aged Out  Hepatitis B Vaccines 19-59 Average Risk  Discontinued    Patient Care Team: Kasra Melvin N, DO as PCP - General (Family Medicine) Duanne Butler DASEN, MD (Family Medicine)       Objective    BP 113/74 (BP Location: Left Arm, Patient Position: Sitting, Cuff Size: Large)   Pulse 88   Temp 98.2 F (36.8 C) (Oral)   Ht 6' 2 (1.88 m)   Wt 296 lb 14.4 oz (134.7 kg)   SpO2 96%   BMI 38.12 kg/m     Physical Exam Vitals and nursing note reviewed.  Constitutional:      General: He is not in acute distress.    Appearance: Normal appearance.  HENT:     Head: Normocephalic and atraumatic.  Eyes:     General: No scleral icterus.    Conjunctiva/sclera: Conjunctivae normal.  Cardiovascular:     Rate and Rhythm: Normal rate.  Pulmonary:     Effort: Pulmonary effort is normal.  Neurological:     Mental Status: He is alert and oriented to person, place, and time. Mental status is at baseline.  Psychiatric:        Mood and Affect: Mood normal.         Behavior: Behavior normal.     Depression Screen    11/19/2023    2:22 PM  PHQ 2/9 Scores  PHQ - 2 Score 0  PHQ- 9 Score 0   No results found for any visits on 11/19/23.  Assessment & Plan     Essential hypertension Assessment & Plan: Chronic, stable on lisinopril 20 mg daily (originally started due to retinal artery occlusion).  No changes today.   Encounter to establish care  Branch retinal vein occlusion of right eye, unspecified complication status (HCC)  History of nephrolithiasis     Encounter to establish care Declined flu, pneumonia, and hepatitis B vaccines. Discussed shingles vaccine efficacy. Considering shingles vaccine after discussion with Doctor Shona. - Provided information sheet on shingles vaccine. - Encouraged consideration of shingles vaccine.  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 - Continue lisinopril to manage blood pressure and prevent further occlusion. - Continue to follow with ophthalmology; defer to specialist management.  History of nephrolithiasis Last episode two years ago. Prefers to avoid CT scans. Considering urologist consultation. - Consider referral to a urologist for further evaluation and management, as previous urologist, Dr. Kassie, retired.     Return in about 2 months (around 01/19/2024) for CPE, Chronic f/u.     I discussed the assessment and treatment plan with the patient  The patient was provided an opportunity to ask questions and all were answered. The patient agreed with the plan and demonstrated an understanding of the instructions.   The patient was advised to call back or seek an in-person evaluation if the symptoms worsen or if the condition fails to improve as anticipated.    LAURAINE LOISE BUOY, DO  [facility] [phone] (phone) [phone] (fax)  [facility]

## 2023-11-19 NOTE — Patient Instructions (Signed)
 Recommend screening for all adults regardless of risk factors: hepatitis C and HIV if not previously done.

## 2023-11-19 NOTE — Assessment & Plan Note (Signed)
 Chronic, stable on lisinopril 20 mg daily (originally started due to retinal artery occlusion).  No changes today.

## 2023-12-08 ENCOUNTER — Telehealth: Payer: Self-pay | Admitting: Family Medicine

## 2023-12-08 ENCOUNTER — Other Ambulatory Visit: Payer: Self-pay

## 2023-12-08 NOTE — Telephone Encounter (Signed)
 Converted to rx request

## 2023-12-08 NOTE — Telephone Encounter (Signed)
 [facility] faxed refill request for the following medications:  lisinopril (ZESTRIL) 20 MG tablet     Please advise.

## 2023-12-09 ENCOUNTER — Other Ambulatory Visit: Payer: Self-pay | Admitting: Family Medicine

## 2023-12-09 ENCOUNTER — Other Ambulatory Visit: Payer: Self-pay

## 2023-12-09 DIAGNOSIS — I10 Essential (primary) hypertension: Secondary | ICD-10-CM

## 2023-12-09 MED ORDER — LISINOPRIL 20 MG PO TABS: 20.0000 mg | ORAL_TABLET | Freq: Every day | ORAL | 0 refills | Status: DC

## 2023-12-15 NOTE — Telephone Encounter (Signed)
 Created in error

## 2024-02-05 ENCOUNTER — Encounter: Payer: Self-pay | Admitting: Family Medicine

## 2024-02-05 ENCOUNTER — Ambulatory Visit (INDEPENDENT_AMBULATORY_CARE_PROVIDER_SITE_OTHER): Admitting: Family Medicine

## 2024-02-05 VITALS — BP 124/85 | HR 79 | Temp 97.6°F | Ht 74.0 in | Wt 305.9 lb

## 2024-02-05 DIAGNOSIS — N529 Male erectile dysfunction, unspecified: Secondary | ICD-10-CM | POA: Diagnosis not present

## 2024-02-05 DIAGNOSIS — Z114 Encounter for screening for human immunodeficiency virus [HIV]: Secondary | ICD-10-CM

## 2024-02-05 DIAGNOSIS — Z0000 Encounter for general adult medical examination without abnormal findings: Secondary | ICD-10-CM

## 2024-02-05 DIAGNOSIS — Z1159 Encounter for screening for other viral diseases: Secondary | ICD-10-CM

## 2024-02-05 DIAGNOSIS — Z0001 Encounter for general adult medical examination with abnormal findings: Secondary | ICD-10-CM

## 2024-02-05 DIAGNOSIS — D126 Benign neoplasm of colon, unspecified: Secondary | ICD-10-CM

## 2024-02-05 DIAGNOSIS — I10 Essential (primary) hypertension: Secondary | ICD-10-CM | POA: Diagnosis not present

## 2024-02-05 MED ORDER — SILDENAFIL CITRATE 100 MG PO TABS: 50.0000 mg | ORAL_TABLET | ORAL | 3 refills | Status: AC | PRN

## 2024-02-05 MED ORDER — LISINOPRIL 20 MG PO TABS: 20.0000 mg | ORAL_TABLET | Freq: Two times a day (BID) | ORAL | 3 refills | Status: AC

## 2024-02-05 NOTE — Assessment & Plan Note (Signed)
 Done in 2022; next colonoscopy due in 2029.

## 2024-02-05 NOTE — Assessment & Plan Note (Signed)
 Chronic, managed with sildenafil  50-100 mg as needed. No changes today.

## 2024-02-05 NOTE — Progress Notes (Signed)
 "    Complete physical exam   Patient: Roy Haley.   DOB: [birth year]   54 y.o. 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 is a 54 y.o. male who presents today for a complete physical exam.   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       Brittain GORMAN Jyl Raddle. Roy Haley is a 54 year old male who presents with intermittent muffled hearing.  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 The sensation has recurred a couple of times. He attempted to relieve it by holding his nose and blowing, which provided temporary relief. Sudafed also seemed to help alleviate the symptoms. He denies any ear pain but mentions experiencing pressure changes. He is concerned about the possibility of a foreign object in his ear, as he reports having previously had to remove things from his ear himself.  He has a history of kidney stones,  which he describes as frequent and troublesome. He underwent a colonoscopy in June 2022 following a positive Cologuard test.  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 Notes   Kidney stones    Medical history non-contributory    Past Surgical History:  Procedure Laterality Date   EXTRACORPOREAL SHOCK WAVE LITHOTRIPSY Bilateral 2000,2010,2012   EXTRACORPOREAL SHOCK WAVE LITHOTRIPSY Left 07/28/2014   Procedure: EXTRACORPOREAL SHOCK WAVE LITHOTRIPSY (ESWL);  Surgeon: Ozell JONELLE Burkes, MD;  Location: [facility];  Service: Urology;  Laterality: Left;   EXTRACORPOREAL SHOCK WAVE LITHOTRIPSY Left 12/07/2015   Procedure: EXTRACORPOREAL SHOCK WAVE LITHOTRIPSY (ESWL);  Surgeon: Ozell JONELLE Burkes, MD;  Location: [facility];  Service: Urology;  Laterality: Left;   EXTRACORPOREAL SHOCK WAVE LITHOTRIPSY Left 06/05/2017   Procedure: EXTRACORPOREAL SHOCK WAVE LITHOTRIPSY (ESWL);  Surgeon: Burkes Ozell JONELLE, MD;  Location: [facility];  Service: Urology;  Laterality: Left;   EXTRACORPOREAL SHOCK WAVE LITHOTRIPSY Right 06/01/2020   Procedure: EXTRACORPOREAL SHOCK WAVE LITHOTRIPSY (ESWL);  Surgeon: Burkes Ozell JONELLE, MD;  Location: [facility];  Service: Urology;  Laterality: Right;   EXTRACORPOREAL SHOCK WAVE LITHOTRIPSY Left 06/29/2020   Procedure: EXTRACORPOREAL SHOCK WAVE LITHOTRIPSY (ESWL);  Surgeon: Kassie Ozell SAUNDERS, MD;  Location: [facility];  Service: Urology;  Laterality: Left;   EXTRACORPOREAL SHOCK WAVE LITHOTRIPSY Left 08/30/2021   Procedure: EXTRACORPOREAL SHOCK WAVE LITHOTRIPSY (ESWL);  Surgeon: Kassie Ozell SAUNDERS, MD;  Location: [facility];  Service: Urology;  Laterality: Left;   Social History   Socioeconomic History   Marital status: Married    Spouse name: Not on file   Number of children:  Not on file   Years of education: Not on file   Highest education level: 12th grade  Occupational History   Not on file  Tobacco Use   Smoking status: Never   Smokeless tobacco: Never  Vaping Use   Vaping status: Never Used  Substance and Sexual Activity   Alcohol use: Never   Drug use: Never   Sexual activity: Yes    Birth control/protection: None  Other Topics Concern   Not on file  Social History Narrative   Not on file   Social Drivers of Health   Tobacco Use: Low Risk (02/05/2024)   Patient History    Smoking Tobacco Use: Never    Smokeless Tobacco Use: Never    Passive Exposure: Not on file  Financial Resource Strain: Low Risk (11/18/2023)   Overall Financial Resource Strain (CARDIA)    Difficulty of Paying Living Expenses: Not very hard  Food Insecurity: No Food Insecurity (11/18/2023)   Epic    Worried About Programme Researcher, Broadcasting/film/video in the Last Year: Never true    Ran Out of Food in the Last Year: Never true  Transportation Needs: No Transportation Needs (11/18/2023)   Epic    Lack of Transportation (Medical): No    Lack of Transportation (Non-Medical): No  Physical Activity: Insufficiently Active (11/18/2023)   Exercise Vital Sign    Days of Exercise per Week: 6 days    Minutes of Exercise per Session: 20 min  Stress: No Stress Concern Present (11/18/2023)   [employer] of Occupational Health - Occupational Stress Questionnaire    Feeling of Stress: Not at all  Social Connections: Unknown (11/18/2023)   Social Connection and Isolation Panel    Frequency of Communication with Friends and Family: More than three times a week    Frequency of Social Gatherings with Friends and Family: More than three times a week    Attends Religious Services: More than 4 times per year    Active Member of Clubs or Organizations: Patient declined    Attends Banker Meetings: Not on file    Marital Status: Married  Intimate Partner Violence: Not At Risk  (11/19/2023)   Epic    Fear of Current or Ex-Partner: No    Emotionally Abused: No    Physically Abused: No    Sexually Abused: No  Depression (PHQ2-9): Low Risk (02/05/2024)   Depression (PHQ2-9)    PHQ-2 Score: 0  Alcohol Screen: Low Risk (11/19/2023)   Alcohol Screen    Last Alcohol Screening Score (AUDIT): 0  Housing: Low Risk (11/18/2023)   Epic    Unable to Pay for Housing in the Last Year: No    Number of Times Moved in the Last Year: 0    Homeless in the Last Year: No  Utilities: Not At Risk (11/19/2023)   Epic    Threatened with loss of utilities: No  Health Literacy: Adequate Health  Literacy (11/19/2023)   B1300 Health Literacy    Frequency of need for help with medical instructions: Never   Family Status  Relation Name Status   Mother Beaumont Austad Alive   Father Adonnis Shen Sr. Alive  No partnership data on file   Family History  Problem Relation Age of Onset   Diabetes Mother    Hypertension Father    Allergies[1]  Patient Care Team: Tanika Bracco, Lauraine SAILOR, DO as PCP - General (Family Medicine) Duanne Butler DASEN, MD (Family Medicine)   Medications: Show/hide medication list[2]  Review of Systems  Constitutional:  Negative for appetite change, chills, fatigue and fever.  HENT:  Negative for congestion, ear pain, hearing loss, nosebleeds and trouble swallowing.        +muffled sound in right ear (currently resolved)  Eyes:  Negative for pain and visual disturbance.  Respiratory:  Negative for cough, chest tightness and shortness of breath.   Cardiovascular:  Negative for chest pain, palpitations and leg swelling.  Gastrointestinal:  Negative for abdominal pain, blood in stool, constipation, diarrhea, nausea and vomiting.  Endocrine: Negative for polydipsia, polyphagia and polyuria.  Genitourinary:  Negative for dysuria and flank pain.  Musculoskeletal:  Negative for arthralgias, back pain, joint swelling, myalgias and neck stiffness.  Skin:  Negative  for color change, rash and wound.  Neurological:  Negative for dizziness, tremors, seizures, speech difficulty, weakness, light-headedness and headaches.  Psychiatric/Behavioral:  Negative for behavioral problems, confusion, decreased concentration, dysphoric mood and sleep disturbance. The patient is not nervous/anxious.   All other systems reviewed and are negative.     Objective    BP 124/85 (BP Location: Right Arm, Patient Position: Sitting, Cuff Size: Large)   Pulse 79   Temp 97.6 F (36.4 C) (Oral)   Ht 6' 2 (1.88 m)   Wt (!) 305 lb 14.4 oz (138.8 kg)   SpO2 97%   BMI 39.28 kg/m    Physical Exam Vitals and nursing note reviewed.  Constitutional:      General: He is awake.     Appearance: Normal appearance.  HENT:     Head: Normocephalic and atraumatic.     Right Ear: Tympanic membrane, ear canal and external ear normal.     Left Ear: Tympanic membrane, ear canal and external ear normal.     Nose: Nose normal.     Mouth/Throat:     Mouth: Mucous membranes are moist.     Pharynx: Oropharynx is clear. No oropharyngeal exudate or posterior oropharyngeal erythema.  Eyes:     General: No scleral icterus.    Extraocular Movements: Extraocular movements intact.     Conjunctiva/sclera: Conjunctivae normal.     Pupils: Pupils are equal, round, and reactive to light.  Neck:     Thyroid: No thyromegaly or thyroid tenderness.  Cardiovascular:     Rate and Rhythm: Normal rate and regular rhythm.     Pulses: Normal pulses.     Heart sounds: Normal heart sounds.  Pulmonary:     Effort: Pulmonary effort is normal. No tachypnea, bradypnea or respiratory distress.     Breath sounds: Normal breath sounds. No stridor. No wheezing, rhonchi or rales.  Abdominal:     General: Bowel sounds are normal. There is no distension.     Palpations: Abdomen is soft. There is no mass.     Tenderness: There is no abdominal tenderness. There is no guarding.     Hernia: No hernia is present.   Musculoskeletal:  Cervical back: Normal range of motion and neck supple.     Right lower leg: No edema.     Left lower leg: No edema.  Lymphadenopathy:     Cervical: No cervical adenopathy.  Skin:    General: Skin is warm and dry.  Neurological:     Mental Status: He is alert and oriented to person, place, and time. Mental status is at baseline.  Psychiatric:        Mood and Affect: Mood normal.        Behavior: Behavior normal.       Last depression screening scores    02/05/2024    8:20 AM 11/19/2023    2:22 PM  PHQ 2/9 Scores  PHQ - 2 Score 0 0  PHQ- 9 Score 0 0   Last fall risk screening    02/05/2024    8:20 AM  Fall Risk   Falls in the past year? 0  Number falls in past yr: 0  Injury with Fall? 0  Risk for fall due to : No Fall Risks   Last Audit-C alcohol use screening    11/19/2023    2:06 PM  Alcohol Use Disorder Test (AUDIT)  1. How often do you have a drink containing alcohol? 0  2. How many drinks containing alcohol do you have on a typical day when you are drinking? 0  3. How often do you have six or more drinks on one occasion? 0  AUDIT-C Score 0   A score of 3 or more in women, and 4 or more in men indicates increased risk for alcohol abuse, EXCEPT if all of the points are from question 1   No results found for any visits on 02/05/24.  Assessment & Plan    Routine Health Maintenance and Physical Exam  Exercise Activities and Dietary recommendations  Goals   None     Immunization History  Administered Date(s) Administered   Tdap 06/04/2017    Health Maintenance  Topic Date Due   Influenza Vaccine  04/06/2024 (Originally 08/08/2023)   Hepatitis C Screening  04/07/2024 (Originally 01/21/1987)   HIV Screening  04/07/2024 (Originally 01/21/1984)   Zoster Vaccines- Shingrix (1 of 2) 06/07/2024 (Originally 01/21/2019)   COVID-19 Vaccine (1 - 2025-26 season) 09/07/2024 (Originally 09/08/2023)   Pneumococcal Vaccine: 50+ Years (1 of 1 - PCV)  11/18/2024 (Originally 01/21/2019)   DTaP/Tdap/Td (2 - Td or Tdap) 06/05/2027   Colonoscopy  07/06/2027   HPV VACCINES (No Doses Required) Completed   Meningococcal B Vaccine  Aged Out   Hepatitis B Vaccines 19-59 Average Risk  Discontinued    Discussed health benefits of physical activity, and encouraged him to engage in regular exercise appropriate for his age and condition.   Annual physical exam -     CBC with Differential/Platelet -     Comprehensive metabolic panel with GFR -     Lipid Panel With LDL/HDL Ratio  Essential hypertension -     CBC with Differential/Platelet -     Comprehensive metabolic panel with GFR -     Lipid Panel With LDL/HDL Ratio -     Lisinopril ; Take 1 tablet (20 mg total) by mouth 2 (two) times daily.  Dispense: 180 tablet; Refill: 3  Adenomatous polyp of colon, unspecified part of colon Assessment & Plan: Done in 2022; next colonoscopy due in 2029.   Erectile dysfunction, unspecified erectile dysfunction type Assessment & Plan: Chronic, managed with sildenafil  50-100 mg as needed. No changes  today.  Orders: -     Sildenafil  Citrate; Take 0.5-1 tablets (50-100 mg total) by mouth as needed for erectile dysfunction.  Dispense: 30 tablet; Refill: 3     Annual Physical Exam Physical exam overall unremarkable except as noted above. Routine lab work ordered as noted.  Discussed hepatitis C and HIV screening. He expressed concern about potential out-of-pocket costs and has not had these screenings done previously.  He will be checking with his insurance and will request orders for the screenings if they are covered. - Check with insurance regarding coverage for hepatitis C and HIV screening.    Return for CPE w/Dr. Franchot.     I discussed the assessment and treatment plan with the patient  The patient was provided an opportunity to ask questions and all were answered. The patient agreed with the plan and demonstrated an understanding of the  instructions.   The patient was advised to call back or seek an in-person evaluation if the symptoms worsen or if the condition fails to improve as anticipated.    LAURAINE LOISE BUOY, DO  [place] [facility] [phone] (phone) [phone] (fax)  [facility]     [1]  Allergies Allergen Reactions   Penicillins Rash  [2]  Outpatient Medications Prior to Visit  Medication Sig   [DISCONTINUED] lisinopril  (ZESTRIL ) 20 MG tablet TAKE ONE TABLET BY MOUTH TWICE DAILY   [DISCONTINUED] sildenafil  (VIAGRA ) 100 MG tablet Take 50-100 mg by mouth as needed for erectile dysfunction.   No facility-administered medications prior to visit.   "

## 2024-02-06 LAB — CBC WITH DIFFERENTIAL/PLATELET
Basophils Absolute: 0 10*3/uL (ref 0.0–0.2)
Basos: 0 %
EOS (ABSOLUTE): 0.2 10*3/uL (ref 0.0–0.4)
Eos: 2 %
Hematocrit: 51 % (ref 37.5–51.0)
Hemoglobin: 16.7 g/dL (ref 13.0–17.7)
Immature Grans (Abs): 0 10*3/uL (ref 0.0–0.1)
Immature Granulocytes: 0 %
Lymphocytes Absolute: 3.5 10*3/uL — ABNORMAL HIGH (ref 0.7–3.1)
Lymphs: 38 %
MCH: 31.2 pg (ref 26.6–33.0)
MCHC: 32.7 g/dL (ref 31.5–35.7)
MCV: 95 fL (ref 79–97)
Monocytes Absolute: 0.8 10*3/uL (ref 0.1–0.9)
Monocytes: 8 %
Neutrophils Absolute: 4.7 10*3/uL (ref 1.4–7.0)
Neutrophils: 52 %
Platelets: 290 10*3/uL (ref 150–450)
RBC: 5.36 x10E6/uL (ref 4.14–5.80)
RDW: 12.6 % (ref 11.6–15.4)
WBC: 9.3 10*3/uL (ref 3.4–10.8)

## 2024-02-06 LAB — LIPID PANEL WITH LDL/HDL RATIO
Cholesterol, Total: 211 mg/dL — ABNORMAL HIGH (ref 100–199)
HDL: 41 mg/dL
LDL Chol Calc (NIH): 126 mg/dL — ABNORMAL HIGH (ref 0–99)
LDL/HDL Ratio: 3.1 ratio (ref 0.0–3.6)
Triglycerides: 249 mg/dL — ABNORMAL HIGH (ref 0–149)
VLDL Cholesterol Cal: 44 mg/dL — ABNORMAL HIGH (ref 5–40)

## 2024-02-06 LAB — COMPREHENSIVE METABOLIC PANEL WITH GFR
ALT: 54 [IU]/L — ABNORMAL HIGH (ref 0–44)
AST: 32 [IU]/L (ref 0–40)
Albumin: 4.5 g/dL (ref 3.8–4.9)
Alkaline Phosphatase: 63 [IU]/L (ref 47–123)
BUN/Creatinine Ratio: 15 (ref 9–20)
BUN: 15 mg/dL (ref 6–24)
Bilirubin Total: 0.5 mg/dL (ref 0.0–1.2)
CO2: 21 mmol/L (ref 20–29)
Calcium: 9.4 mg/dL (ref 8.7–10.2)
Chloride: 99 mmol/L (ref 96–106)
Creatinine, Ser: 1.03 mg/dL (ref 0.76–1.27)
Globulin, Total: 2.5 g/dL (ref 1.5–4.5)
Glucose: 98 mg/dL (ref 70–99)
Potassium: 4.6 mmol/L (ref 3.5–5.2)
Sodium: 137 mmol/L (ref 134–144)
Total Protein: 7 g/dL (ref 6.0–8.5)
eGFR: 86 mL/min/{1.73_m2}

## 2025-02-08 ENCOUNTER — Encounter
